# Patient Record
Sex: Female | Born: 2000 | Race: White | Hispanic: No | Marital: Single | Smoking: Never smoker
Health system: Southern US, Community
[De-identification: ages and names within clinical notes are randomized; demographics above are authoritative.]

## PROBLEM LIST (undated history)

## (undated) DIAGNOSIS — F32A Depression, unspecified: Secondary | ICD-10-CM

## (undated) DIAGNOSIS — T7840XA Allergy, unspecified, initial encounter: Secondary | ICD-10-CM

## (undated) DIAGNOSIS — Z720 Tobacco use: Secondary | ICD-10-CM

## (undated) DIAGNOSIS — J45909 Unspecified asthma, uncomplicated: Secondary | ICD-10-CM

## (undated) DIAGNOSIS — F1290 Cannabis use, unspecified, uncomplicated: Secondary | ICD-10-CM

## (undated) DIAGNOSIS — L0591 Pilonidal cyst without abscess: Secondary | ICD-10-CM

## (undated) DIAGNOSIS — F419 Anxiety disorder, unspecified: Secondary | ICD-10-CM

## (undated) DIAGNOSIS — K219 Gastro-esophageal reflux disease without esophagitis: Secondary | ICD-10-CM

## (undated) DIAGNOSIS — Z862 Personal history of diseases of the blood and blood-forming organs and certain disorders involving the immune mechanism: Secondary | ICD-10-CM

## (undated) DIAGNOSIS — L732 Hidradenitis suppurativa: Secondary | ICD-10-CM

## (undated) HISTORY — DX: Anxiety disorder, unspecified: F41.9

## (undated) HISTORY — DX: Unspecified asthma, uncomplicated: J45.909

## (undated) HISTORY — DX: Allergy, unspecified, initial encounter: T78.40XA

## (undated) HISTORY — DX: Depression, unspecified: F32.A

## (undated) HISTORY — PX: NO PAST SURGERIES: SHX2092

---
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
## 2021-04-03 DIAGNOSIS — R5383 Other fatigue: Secondary | ICD-10-CM | POA: Diagnosis not present

## 2021-04-03 DIAGNOSIS — R69 Illness, unspecified: Secondary | ICD-10-CM | POA: Diagnosis not present

## 2021-04-08 DIAGNOSIS — R5382 Chronic fatigue, unspecified: Secondary | ICD-10-CM | POA: Diagnosis not present

## 2021-04-23 DIAGNOSIS — R69 Illness, unspecified: Secondary | ICD-10-CM | POA: Diagnosis not present

## 2021-04-23 DIAGNOSIS — F321 Major depressive disorder, single episode, moderate: Secondary | ICD-10-CM | POA: Diagnosis not present

## 2021-05-15 DIAGNOSIS — F321 Major depressive disorder, single episode, moderate: Secondary | ICD-10-CM | POA: Diagnosis not present

## 2021-05-15 DIAGNOSIS — R69 Illness, unspecified: Secondary | ICD-10-CM | POA: Diagnosis not present

## 2021-05-22 DIAGNOSIS — R69 Illness, unspecified: Secondary | ICD-10-CM | POA: Diagnosis not present

## 2021-05-22 DIAGNOSIS — F321 Major depressive disorder, single episode, moderate: Secondary | ICD-10-CM | POA: Diagnosis not present

## 2021-05-29 DIAGNOSIS — F321 Major depressive disorder, single episode, moderate: Secondary | ICD-10-CM | POA: Diagnosis not present

## 2021-05-29 DIAGNOSIS — R69 Illness, unspecified: Secondary | ICD-10-CM | POA: Diagnosis not present

## 2021-06-05 DIAGNOSIS — R69 Illness, unspecified: Secondary | ICD-10-CM | POA: Diagnosis not present

## 2021-06-05 DIAGNOSIS — F321 Major depressive disorder, single episode, moderate: Secondary | ICD-10-CM | POA: Diagnosis not present

## 2021-06-12 DIAGNOSIS — F321 Major depressive disorder, single episode, moderate: Secondary | ICD-10-CM | POA: Diagnosis not present

## 2021-06-12 DIAGNOSIS — R69 Illness, unspecified: Secondary | ICD-10-CM | POA: Diagnosis not present

## 2021-06-17 DIAGNOSIS — R69 Illness, unspecified: Secondary | ICD-10-CM | POA: Diagnosis not present

## 2021-06-17 DIAGNOSIS — F321 Major depressive disorder, single episode, moderate: Secondary | ICD-10-CM | POA: Diagnosis not present

## 2021-06-23 DIAGNOSIS — F321 Major depressive disorder, single episode, moderate: Secondary | ICD-10-CM | POA: Diagnosis not present

## 2021-06-23 DIAGNOSIS — R69 Illness, unspecified: Secondary | ICD-10-CM | POA: Diagnosis not present

## 2021-07-03 DIAGNOSIS — F321 Major depressive disorder, single episode, moderate: Secondary | ICD-10-CM | POA: Diagnosis not present

## 2021-07-03 DIAGNOSIS — R69 Illness, unspecified: Secondary | ICD-10-CM | POA: Diagnosis not present

## 2021-07-10 DIAGNOSIS — R69 Illness, unspecified: Secondary | ICD-10-CM | POA: Diagnosis not present

## 2021-07-10 DIAGNOSIS — F321 Major depressive disorder, single episode, moderate: Secondary | ICD-10-CM | POA: Diagnosis not present

## 2021-07-15 DIAGNOSIS — R5383 Other fatigue: Secondary | ICD-10-CM | POA: Diagnosis not present

## 2021-07-24 DIAGNOSIS — F321 Major depressive disorder, single episode, moderate: Secondary | ICD-10-CM | POA: Diagnosis not present

## 2021-07-24 DIAGNOSIS — R69 Illness, unspecified: Secondary | ICD-10-CM | POA: Diagnosis not present

## 2021-07-28 DIAGNOSIS — Z6822 Body mass index (BMI) 22.0-22.9, adult: Secondary | ICD-10-CM | POA: Diagnosis not present

## 2021-07-28 DIAGNOSIS — R69 Illness, unspecified: Secondary | ICD-10-CM | POA: Diagnosis not present

## 2021-07-31 DIAGNOSIS — F321 Major depressive disorder, single episode, moderate: Secondary | ICD-10-CM | POA: Diagnosis not present

## 2021-07-31 DIAGNOSIS — R69 Illness, unspecified: Secondary | ICD-10-CM | POA: Diagnosis not present

## 2021-08-07 DIAGNOSIS — F321 Major depressive disorder, single episode, moderate: Secondary | ICD-10-CM | POA: Diagnosis not present

## 2021-08-07 DIAGNOSIS — R69 Illness, unspecified: Secondary | ICD-10-CM | POA: Diagnosis not present

## 2021-08-14 DIAGNOSIS — R69 Illness, unspecified: Secondary | ICD-10-CM | POA: Diagnosis not present

## 2021-08-14 DIAGNOSIS — F321 Major depressive disorder, single episode, moderate: Secondary | ICD-10-CM | POA: Diagnosis not present

## 2021-08-21 DIAGNOSIS — F321 Major depressive disorder, single episode, moderate: Secondary | ICD-10-CM | POA: Diagnosis not present

## 2021-08-21 DIAGNOSIS — R69 Illness, unspecified: Secondary | ICD-10-CM | POA: Diagnosis not present

## 2021-08-28 DIAGNOSIS — F321 Major depressive disorder, single episode, moderate: Secondary | ICD-10-CM | POA: Diagnosis not present

## 2021-08-28 DIAGNOSIS — R69 Illness, unspecified: Secondary | ICD-10-CM | POA: Diagnosis not present

## 2021-09-04 DIAGNOSIS — F321 Major depressive disorder, single episode, moderate: Secondary | ICD-10-CM | POA: Diagnosis not present

## 2021-09-04 DIAGNOSIS — R69 Illness, unspecified: Secondary | ICD-10-CM | POA: Diagnosis not present

## 2021-09-11 DIAGNOSIS — R69 Illness, unspecified: Secondary | ICD-10-CM | POA: Diagnosis not present

## 2021-09-11 DIAGNOSIS — F321 Major depressive disorder, single episode, moderate: Secondary | ICD-10-CM | POA: Diagnosis not present

## 2021-09-18 DIAGNOSIS — R69 Illness, unspecified: Secondary | ICD-10-CM | POA: Diagnosis not present

## 2021-09-18 DIAGNOSIS — F321 Major depressive disorder, single episode, moderate: Secondary | ICD-10-CM | POA: Diagnosis not present

## 2021-09-25 DIAGNOSIS — R69 Illness, unspecified: Secondary | ICD-10-CM | POA: Diagnosis not present

## 2021-09-25 DIAGNOSIS — F321 Major depressive disorder, single episode, moderate: Secondary | ICD-10-CM | POA: Diagnosis not present

## 2021-10-04 DIAGNOSIS — F321 Major depressive disorder, single episode, moderate: Secondary | ICD-10-CM | POA: Diagnosis not present

## 2021-10-04 DIAGNOSIS — R69 Illness, unspecified: Secondary | ICD-10-CM | POA: Diagnosis not present

## 2021-10-11 DIAGNOSIS — R69 Illness, unspecified: Secondary | ICD-10-CM | POA: Diagnosis not present

## 2021-10-11 DIAGNOSIS — F321 Major depressive disorder, single episode, moderate: Secondary | ICD-10-CM | POA: Diagnosis not present

## 2021-10-14 DIAGNOSIS — R69 Illness, unspecified: Secondary | ICD-10-CM | POA: Diagnosis not present

## 2021-10-14 DIAGNOSIS — F321 Major depressive disorder, single episode, moderate: Secondary | ICD-10-CM | POA: Diagnosis not present

## 2021-10-25 DIAGNOSIS — F321 Major depressive disorder, single episode, moderate: Secondary | ICD-10-CM | POA: Diagnosis not present

## 2021-10-25 DIAGNOSIS — R69 Illness, unspecified: Secondary | ICD-10-CM | POA: Diagnosis not present

## 2021-11-01 DIAGNOSIS — R69 Illness, unspecified: Secondary | ICD-10-CM | POA: Diagnosis not present

## 2021-11-01 DIAGNOSIS — F321 Major depressive disorder, single episode, moderate: Secondary | ICD-10-CM | POA: Diagnosis not present

## 2021-12-20 DIAGNOSIS — F321 Major depressive disorder, single episode, moderate: Secondary | ICD-10-CM | POA: Diagnosis not present

## 2021-12-20 DIAGNOSIS — F411 Generalized anxiety disorder: Secondary | ICD-10-CM | POA: Diagnosis not present

## 2021-12-20 DIAGNOSIS — R69 Illness, unspecified: Secondary | ICD-10-CM | POA: Diagnosis not present

## 2022-01-02 DIAGNOSIS — R69 Illness, unspecified: Secondary | ICD-10-CM | POA: Diagnosis not present

## 2022-01-02 DIAGNOSIS — F321 Major depressive disorder, single episode, moderate: Secondary | ICD-10-CM | POA: Diagnosis not present

## 2022-01-10 DIAGNOSIS — R69 Illness, unspecified: Secondary | ICD-10-CM | POA: Diagnosis not present

## 2022-01-10 DIAGNOSIS — F321 Major depressive disorder, single episode, moderate: Secondary | ICD-10-CM | POA: Diagnosis not present

## 2022-11-22 NOTE — Progress Notes (Unsigned)
New patient visit  Patient: Kerry Mcgee   DOB: 2001-01-09   22 y.o. Adult  MRN: [id number] Visit Date: 11/26/2022  Today's healthcare provider: Debera Lat, PA-C   No chief complaint on file.  Subjective    Kerry Mcgee WEIGHT is a 22 y.o. adult who presents today as a new patient to establish care.  HPI  *** Discussed the use of AI scribe software for clinical note transcription with the patient, who gave verbal consent to proceed.  History of Present Illness            Past Medical History:  Diagnosis Date   Asthma    No past surgical history on file. No family status information on file.   No family history on file. Social History   Socioeconomic History   Marital status: Single    Spouse name: Not on file   Number of children: Not on file   Years of education: Not on file   Highest education level: Not on file  Occupational History   Not on file  Tobacco Use   Smoking status: Never   Smokeless tobacco: Not on file  Substance and Sexual Activity   Alcohol use: No    Alcohol/week: 0.0 standard drinks of alcohol   Drug use: No   Sexual activity: Not on file  Other Topics Concern   Not on file  Social History Narrative   Not on file   Social Determinants of Health   Financial Resource Strain: Not on file  Food Insecurity: Not on file  Transportation Needs: Not on file  Physical Activity: Not on file  Stress: Not on file  Social Connections: Not on file   Outpatient Medications Prior to Visit  Medication Sig   albuterol (PROVENTIL HFA;VENTOLIN HFA) 108 (90 BASE) MCG/ACT inhaler Inhale 1 puff into the lungs every 6 (six) hours as needed for wheezing or shortness of breath.   beclomethasone (QVAR) 40 MCG/ACT inhaler Inhale into the lungs 2 (two) times daily.   cetirizine (ZYRTEC) 10 MG tablet Take 10 mg by mouth daily.   montelukast (SINGULAIR) 10 MG tablet Take 10 mg by mouth at bedtime.   Olopatadine HCl 0.6 % SOLN Place into the nose.   No  facility-administered medications prior to visit.   Allergies  Allergen Reactions   Dust Mite Extract Other (See Comments)    Wheezing    Other Other (See Comments)    Cats - wheezing   Shellfish Allergy Other (See Comments)    Allergy tested - tested positive   Sulfa Antibiotics Rash    Immunization History  Administered Date(s) Administered   PFIZER(Purple Top)SARS-COV-2 Vaccination 06/03/2019, 06/28/2019    Health Maintenance  Topic Date Due   HPV VACCINES (1 - 3-dose series) Never done   HIV Screening  Never done   Hepatitis C Screening  Never done   DTaP/Tdap/Td (1 - Tdap) Never done   Cervical Cancer Screening (Pap smear)  Never done   INFLUENZA VACCINE  Never done   COVID-19 Vaccine (3 - 2023-24 season) 10/25/2022    Patient Care Team: Charlton Amor, MD as PCP - General (Pediatrics) Kieth Brightly, MD (General Surgery) Charlton Amor, MD as Referring Physician (Pediatrics)  Review of Systems  All other systems reviewed and are negative.  Except see HPI   {Insert previous labs (optional):23779} {See past labs  Heme  Chem  Endocrine  Serology  Results Review (optional):1}   Objective    There were no  vitals taken for this visit. {Insert last BP/Wt (optional):23777}{See vitals history (optional):1}   Physical Exam Vitals reviewed.  Constitutional:      General: [name].    Appearance: Normal appearance. [name] is not diaphoretic.  HENT:     Head: Normocephalic and atraumatic.  Eyes:     General: No scleral icterus.    Conjunctiva/sclera: Conjunctivae normal.  Cardiovascular:     Rate and Rhythm: Normal rate and regular rhythm.     Pulses: Normal pulses.     Heart sounds: Normal heart sounds. No murmur heard. Pulmonary:     Effort: Pulmonary effort is normal. No respiratory distress.     Breath sounds: Normal breath sounds. No wheezing or rhonchi.  Musculoskeletal:     Cervical back: Neck supple.     Right  lower leg: No edema.     Left lower leg: No edema.  Lymphadenopathy:     Cervical: No cervical adenopathy.  Skin:    General: Skin is warm and dry.     Findings: No rash.  Neurological:     Mental Status: [name] is alert and oriented to person, place, and time. Mental status is at baseline.  Psychiatric:        Mood and Affect: Mood normal.        Behavior: Behavior normal.     Depression Screen     No data to display         No results found for any visits on 11/26/22.  Assessment & Plan     *** Assessment and Plan              Encounter to establish care Welcomed to our clinic Reviewed past medical hx, social hx, family hx and surgical hx Pt advised to send all vaccination records or screening   No follow-ups on file.    The patient was advised to call back or seek an in-person evaluation if the symptoms worsen or if the condition fails to improve as anticipated.  I discussed the assessment and treatment plan with the patient. The patient was provided an opportunity to ask questions and all were answered. The patient agreed with the plan and demonstrated an understanding of the instructions.  I, Debera Lat, PA-C have reviewed all documentation for this visit. The documentation on  11/26/22  for the exam, diagnosis, procedures, and orders are all accurate and complete.  Debera Lat, [facility], MMS [facility] [phone] (phone) [phone] (fax)  [facility]

## 2022-11-26 ENCOUNTER — Encounter: Payer: Self-pay | Admitting: Physician Assistant

## 2022-11-26 ENCOUNTER — Other Ambulatory Visit: Payer: Self-pay

## 2022-11-26 ENCOUNTER — Ambulatory Visit: Payer: Managed Care, Other (non HMO) | Admitting: Physician Assistant

## 2022-11-26 VITALS — BP 120/69 | HR 81 | Temp 97.7°F | Ht 63.0 in | Wt 141.6 lb

## 2022-11-26 DIAGNOSIS — F419 Anxiety disorder, unspecified: Secondary | ICD-10-CM

## 2022-11-26 DIAGNOSIS — R5383 Other fatigue: Secondary | ICD-10-CM

## 2022-11-26 DIAGNOSIS — J302 Other seasonal allergic rhinitis: Secondary | ICD-10-CM | POA: Diagnosis not present

## 2022-11-26 DIAGNOSIS — Z7689 Persons encountering health services in other specified circumstances: Secondary | ICD-10-CM

## 2022-11-26 DIAGNOSIS — Z23 Encounter for immunization: Secondary | ICD-10-CM

## 2022-11-26 DIAGNOSIS — F32A Depression, unspecified: Secondary | ICD-10-CM | POA: Insufficient documentation

## 2022-11-26 DIAGNOSIS — R0683 Snoring: Secondary | ICD-10-CM

## 2022-11-26 DIAGNOSIS — J4540 Moderate persistent asthma, uncomplicated: Secondary | ICD-10-CM | POA: Diagnosis not present

## 2022-11-26 MED ORDER — ALBUTEROL SULFATE HFA 108 (90 BASE) MCG/ACT IN AERS
INHALATION_SPRAY | RESPIRATORY_TRACT | 11 refills | Status: AC
  Filled 2022-11-26: qty 6.7, 25d supply, fill #0

## 2022-11-26 MED ORDER — MONTELUKAST SODIUM 10 MG PO TABS
10.0000 mg | ORAL_TABLET | Freq: Every day | ORAL | 11 refills | Status: DC
  Filled 2022-11-26: qty 30, 30d supply, fill #0
  Filled 2023-01-04: qty 30, 30d supply, fill #1
  Filled 2023-02-11: qty 30, 30d supply, fill #2
  Filled 2023-03-18: qty 30, 30d supply, fill #3

## 2022-11-26 MED ORDER — FLUOXETINE HCL 10 MG PO CAPS
10.0000 mg | ORAL_CAPSULE | Freq: Every day | ORAL | 3 refills | Status: DC
Start: 2022-11-26 — End: 2023-09-29
  Filled 2022-11-26: qty 90, 90d supply, fill #0
  Filled 2023-03-18: qty 30, 30d supply, fill #1

## 2022-11-26 MED ORDER — BECLOMETHASONE DIPROP HFA 40 MCG/ACT IN AERB
1.0000 | INHALATION_SPRAY | Freq: Two times a day (BID) | RESPIRATORY_TRACT | 2 refills | Status: DC
Start: 2022-11-26 — End: 2023-01-05
  Filled 2022-11-26: qty 10.6, 30d supply, fill #0

## 2022-11-26 MED ORDER — BUDESONIDE-FORMOTEROL FUMARATE 80-4.5 MCG/ACT IN AERO
2.0000 | INHALATION_SPRAY | Freq: Two times a day (BID) | RESPIRATORY_TRACT | 3 refills | Status: DC
Start: 2022-11-26 — End: 2023-12-30
  Filled 2022-11-26 – 2023-01-15 (×2): qty 10.2, 30d supply, fill #0

## 2022-11-26 MED ORDER — CETIRIZINE HCL 10 MG PO TABS
10.0000 mg | ORAL_TABLET | Freq: Every day | ORAL | 11 refills | Status: AC
  Filled 2022-11-26: qty 30, 30d supply, fill #0
  Filled 2023-01-04: qty 30, 30d supply, fill #1
  Filled 2023-02-11: qty 30, 30d supply, fill #2
  Filled 2023-03-18: qty 30, 30d supply, fill #3

## 2022-11-26 MED ORDER — FLUTICASONE FUROATE 100 MCG/ACT IN AEPB
INHALATION_SPRAY | RESPIRATORY_TRACT | 1 refills | Status: DC
Start: 2022-11-26 — End: 2023-12-30
  Filled 2022-11-26: qty 30, fill #0

## 2022-11-27 LAB — TSH: TSH: 2.73 u[IU]/mL (ref 0.450–4.500)

## 2022-11-27 LAB — COMPREHENSIVE METABOLIC PANEL
ALT: 17 [IU]/L (ref 0–32)
AST: 24 [IU]/L (ref 0–40)
Albumin: 4.2 g/dL (ref 4.0–5.0)
Alkaline Phosphatase: 76 [IU]/L (ref 44–121)
BUN/Creatinine Ratio: 14 (ref 9–23)
BUN: 10 mg/dL (ref 6–20)
Bilirubin Total: 0.3 mg/dL (ref 0.0–1.2)
CO2: 22 mmol/L (ref 20–29)
Calcium: 9.4 mg/dL (ref 8.7–10.2)
Chloride: 103 mmol/L (ref 96–106)
Creatinine, Ser: 0.73 mg/dL (ref 0.57–1.00)
Globulin, Total: 2.6 g/dL (ref 1.5–4.5)
Glucose: 87 mg/dL (ref 70–99)
Potassium: 4.5 mmol/L (ref 3.5–5.2)
Sodium: 140 mmol/L (ref 134–144)
Total Protein: 6.8 g/dL (ref 6.0–8.5)
eGFR: 120 mL/min/{1.73_m2} (ref >59)

## 2022-11-27 LAB — LIPID PANEL
Chol/HDL Ratio: 3 {ratio} (ref 0.0–4.4)
Cholesterol, Total: 169 mg/dL (ref 100–199)
HDL: 56 mg/dL (ref >39)
LDL Chol Calc (NIH): 100 mg/dL — ABNORMAL HIGH (ref 0–99)
Triglycerides: 70 mg/dL (ref 0–149)
VLDL Cholesterol Cal: 13 mg/dL (ref 5–40)

## 2022-11-27 LAB — CBC WITH DIFFERENTIAL/PLATELET
Basophils Absolute: 0.1 10*3/uL (ref 0.0–0.2)
Basos: 1 %
EOS (ABSOLUTE): 0.7 10*3/uL — ABNORMAL HIGH (ref 0.0–0.4)
Eos: 8 %
Hematocrit: 40.4 % (ref 34.0–46.6)
Hemoglobin: 13 g/dL (ref 11.1–15.9)
Immature Grans (Abs): 0 10*3/uL (ref 0.0–0.1)
Immature Granulocytes: 1 %
Lymphocytes Absolute: 2 10*3/uL (ref 0.7–3.1)
Lymphs: 25 %
MCH: 29.1 pg (ref 26.6–33.0)
MCHC: 32.2 g/dL (ref 31.5–35.7)
MCV: 90 fL (ref 79–97)
Monocytes Absolute: 0.5 10*3/uL (ref 0.1–0.9)
Monocytes: 6 %
Neutrophils Absolute: 4.7 10*3/uL (ref 1.4–7.0)
Neutrophils: 59 %
Platelets: 259 10*3/uL (ref 150–450)
RBC: 4.47 x10E6/uL (ref 3.77–5.28)
RDW: 12.7 % (ref 11.7–15.4)
WBC: 7.9 10*3/uL (ref 3.4–10.8)

## 2022-11-27 LAB — HEMOGLOBIN A1C
Est. average glucose Bld gHb Est-mCnc: 94 mg/dL
Hgb A1c MFr Bld: 4.9 % (ref 4.8–5.6)

## 2022-11-27 NOTE — Progress Notes (Signed)
All labs are stable with some variations. Advised healthy diet and daily exercise.

## 2022-11-30 ENCOUNTER — Other Ambulatory Visit: Payer: Self-pay

## 2022-12-27 NOTE — Progress Notes (Unsigned)
Complete physical exam  Patient: Kerry Mcgee   DOB: 07-31-2000   21 y.o. Adult  MRN: [id number] Visit Date: 01/01/2023  Today's healthcare provider: Debera Lat, PA-C   Chief Complaint  Patient presents with   Annual Exam    Diet - generl Exercise -Patient reports trying to go walk at least once a week for 20 minutes Feeling well Sleeping well Concerns none   Immunizations    Consented to updating tetanus vaccine   Subjective    Kerry Mcgee is a 22 y.o. adult who presents today for a complete physical exam.   Discussed the use of AI scribe software for clinical note transcription with the patient, who gave verbal consent to proceed.  History of Present Illness   The patient presents with severe menstrual pain, which has worsened with age. The pain is so severe that she often resorts to taking acetaminophen, which she otherwise avoids. She reports a family history of severe menstrual pain, as her mother also experienced similar symptoms to the point of vomiting.  In addition to the menstrual pain, the patient reports a change in her vision and believes she may need new glasses. She also mentions a need for a dental appointment due to a cavity in one of her wisdom teeth, which is not currently causing pain. She also reports a recent swelling of a lymph node under her arm, which has since returned to normal.  The patient also mentions a history of asthma, which has improved since she started using a rescue inhaler. She is unsure if her insurance covers the inhaler.  The patient also reports irregular bowel movements, which she attributes to a poor diet. She denies any changes in the consistency of her stool.        Last depression screening scores    01/01/2023    8:24 AM  PHQ 2/9 Scores  PHQ - 2 Score 6  PHQ- 9 Score 22   Last fall risk screening    01/01/2023    8:24 AM  Fall Risk   Falls in the past year? 1  Number falls in past yr: 1  Injury with  Fall? 1  Risk for fall due to : History of fall(s)  Follow up Falls evaluation completed   Last Audit-C alcohol use screening     No data to display         A score of 3 or more in women, and 4 or more in men indicates increased risk for alcohol abuse, EXCEPT if all of the points are from question 1   Past Medical History:  Diagnosis Date   Asthma    History reviewed. No pertinent surgical history. Social History   Socioeconomic History   Marital status: Single    Spouse name: Not on file   Number of children: Not on file   Years of education: Not on file   Highest education level: Not on file  Occupational History   Not on file  Tobacco Use   Smoking status: Never   Smokeless tobacco: Not on file  Substance and Sexual Activity   Alcohol use: No    Alcohol/week: 0.0 standard drinks of alcohol   Drug use: No   Sexual activity: Not on file  Other Topics Concern   Not on file  Social History Narrative   Not on file   Social Determinants of Health   Financial Resource Strain: Not on file  Food Insecurity: Not on file  Transportation Needs: Not on file  Physical Activity: Not on file  Stress: Not on file  Social Connections: Not on file  Intimate Partner Violence: Not on file   No family status information on file.   History reviewed. No pertinent family history. Allergies  Allergen Reactions   Dust Mite Extract Other (See Comments)    Wheezing    Other Other (See Comments)    Cats - wheezing   Shellfish Allergy Other (See Comments)    Allergy tested - tested positive   Sulfa Antibiotics Rash    Patient Care Team: Debera Lat, PA-C as PCP - General (Physician Assistant) Kieth Brightly, MD (General Surgery) Charlton Amor, MD as Referring Physician (Pediatrics)   Medications: Outpatient Medications Prior to Visit  Medication Sig   albuterol (VENTOLIN HFA) 108 (90 Base) MCG/ACT inhaler Inhale 1-2 puffs into the lungs once every 4 to 6  hours as needed.   beclomethasone (QVAR) 40 MCG/ACT inhaler Inhale into the lungs 2 (two) times daily.   budesonide-formoterol (SYMBICORT) 80-4.5 MCG/ACT inhaler Inhale 2 puffs into the lungs 2 (two) times daily.   cetirizine (ZYRTEC) 10 MG tablet Take 1 tablet (10 mg total) by mouth daily.   FLUoxetine (PROZAC) 10 MG capsule Take 1 capsule (10 mg total) by mouth daily.   Fluticasone Furoate 100 MCG/ACT AEPB Inhale 1 puff (100 mcg total) into the lungs once daily. Do not exceed 1 dose per day.   montelukast (SINGULAIR) 10 MG tablet Take 1 tablet (10 mg total) by mouth at bedtime.   olopatadine (PATANOL) 0.1 % ophthalmic solution 1 drop into affected eye Ophthalmic Twice a day for 30 days   Olopatadine HCl 0.6 % SOLN Place into the nose.   beclomethasone (QVAR) 40 MCG/ACT inhaler Inhale 1 puff into the lungs 2 (two) times daily. Initial, 40 to 80 mcg oral inhalation twice daily about 12 hours apart. Titration, may increase dosage for inadequate response after 2 weeks. (Patient not taking: Reported on 01/01/2023)   No facility-administered medications prior to visit.    Review of Systems  All other systems reviewed and are negative.  Except see HPI  {Insert previous labs (optional):23779} {See past labs  Heme  Chem  Endocrine  Serology  Results Review (optional):1}  Objective    BP 116/62 (BP Location: Left Arm, Patient Position: Sitting, Cuff Size: Normal)   Pulse 75   Ht 5\' 3"  (1.6 m)   Wt 143 lb 3.2 oz (65 kg)   LMP 12/31/2022   SpO2 100%   BMI 25.37 kg/m  {Insert last BP/Wt (optional):23777}{See vitals history (optional):1}    Physical Exam Vitals reviewed.  Constitutional:      General: Kerry Mcgee is not in acute distress.    Appearance: Normal appearance. Kerry Mcgee is well-developed. Kerry Mcgee is not ill-appearing, toxic-appearing or diaphoretic.  HENT:     Head: Normocephalic and atraumatic.     Right Ear: Tympanic membrane, ear canal and external ear normal.     Left Ear: Tympanic  membrane, ear canal and external ear normal.     Nose: Nose normal. No congestion or rhinorrhea.     Mouth/Throat:     Mouth: Mucous membranes are moist.     Pharynx: Oropharynx is clear. No oropharyngeal exudate.  Eyes:     General: No scleral icterus.       Right eye: No discharge.        Left eye: No discharge.     Conjunctiva/sclera: Conjunctivae normal.  Pupils: Pupils are equal, round, and reactive to light.  Neck:     Thyroid: No thyromegaly.     Vascular: No carotid bruit.  Cardiovascular:     Rate and Rhythm: Normal rate and regular rhythm.     Pulses: Normal pulses.     Heart sounds: Normal heart sounds. No murmur heard.    No friction rub. No gallop.  Pulmonary:     Effort: Pulmonary effort is normal. No respiratory distress.     Breath sounds: Normal breath sounds. No wheezing or rales.  Abdominal:     General: Abdomen is flat. Bowel sounds are normal. There is no distension.     Palpations: Abdomen is soft. There is no mass.     Tenderness: There is no abdominal tenderness. There is no right CVA tenderness, left CVA tenderness, guarding or rebound.     Hernia: No hernia is present.  Musculoskeletal:        General: No swelling, tenderness, deformity or signs of injury. Normal range of motion.     Cervical back: Normal range of motion and neck supple. No rigidity or tenderness.     Right lower leg: No edema.     Left lower leg: No edema.  Lymphadenopathy:     Cervical: No cervical adenopathy.  Skin:    General: Skin is warm and dry.     Coloration: Skin is not jaundiced or pale.     Findings: No bruising, erythema, lesion or rash.  Neurological:     Mental Status: Kerry Mcgee is alert and oriented to person, place, and time. Mental status is at baseline.     Gait: Gait normal.  Psychiatric:        Mood and Affect: Mood normal.        Behavior: Behavior normal.        Thought Content: Thought content normal.        Judgment: Judgment normal.      No results  found for any visits on 01/01/23.  Assessment & Plan    Routine Health Maintenance and Physical Exam  Exercise Activities and Dietary recommendations  Goals   None     Immunization History  Administered Date(s) Administered   DTaP 04/07/2001, 06/08/2001, 08/05/2001, 05/30/2002, 06/29/2006   HIB (PRP-OMP) 04/07/2001, 06/08/2001, 08/05/2001, 05/30/2002   HPV 9-valent 10/05/2013, 05/29/2015   Hepatitis A, Ped/Adol-2 Dose 06/29/2006, 09/22/2012   Hepatitis B, PED/ADOLESCENT 09/30/2000, 03/09/2001, 11/23/2001   IPV 04/07/2001, 06/08/2001, 11/23/2001, 06/29/2006   Influenza, Seasonal, Injecte, Preservative Fre 11/26/2022   MMR 02/06/2002, 06/29/2006   Meningococcal B, OMV 04/26/2018, 12/23/2018   Meningococcal Mcv4o 09/22/2012, 03/04/2017   PFIZER(Purple Top)SARS-COV-2 Vaccination 06/03/2019, 06/28/2019   Pneumococcal Conjugate PCV 7 04/07/2001, 06/08/2001, 08/05/2001, 02/06/2002   Tdap 09/22/2012, 01/01/2023   Varicella 02/06/2002, 09/22/2012    Health Maintenance  Topic Date Due   HIV Screening  Never done   Hepatitis C Screening  Never done   Pap Smear  Never done   COVID-19 Vaccine (3 - 2023-24 season) 10/25/2022   DTaP/Tdap/Td vaccine (8 - Td or Tdap) 12/31/2032   Flu Shot  Completed   HPV Vaccine  Completed    Discussed health benefits of physical activity, and encouraged Kerry Mcgee to engage in regular exercise appropriate for Kerry Mcgee's age and condition.  1. Annual physical exam Needs dental/eye exams Things to do to keep yourself healthy  - Exercise at least 30-45 minutes a day, 3-4 days a week.  - Eat a low-fat diet with lots of fruits  and vegetables, up to 7-9 servings per day.  - Seatbelts can save your life. Wear them always.  - Smoke detectors on every level of your home, check batteries every year.  - Eye Doctor - have an eye exam every 1-2 years  - Safe sex - if you may be exposed to STDs, use a condom.  - Alcohol -  If you drink, do it moderately, less than 2 drinks  per day.  - Health Care Power of Attorney. Choose someone to speak for you if you are not able.  - Depression is common in our stressful world.If you're feeling down or losing interest in things you normally enjoy, please come in for a visit.  - Violence - If anyone is threatening or hurting you, please call immediately.   2. Need for tetanus, diphtheria, and acellular pertussis (Tdap) vaccine in patient of adolescent age or older  - Administer Tetanus-diphtheria-acellular pertussis (Tdap) vaccine   Return in about 1 year (around 01/01/2024) for CPE, please, schedule a fu for pap smear in 2 weeks.    The patient was advised to call back or seek an in-person evaluation if the symptoms worsen or if the condition fails to improve as anticipated.  I discussed the assessment and treatment plan with the patient. The patient was provided an opportunity to ask questions and all were answered. The patient agreed with the plan and demonstrated an understanding of the instructions.  I, Debera Lat, PA-C have reviewed all documentation for this visit. The documentation on  111/8/24  for the exam, diagnosis, procedures, and orders are all accurate and complete.  Debera Lat, [facility], MMS [facility] [phone] (phone) [phone] (fax)  [facility]

## 2022-12-30 ENCOUNTER — Encounter: Payer: Managed Care, Other (non HMO) | Admitting: Physician Assistant

## 2023-01-01 ENCOUNTER — Encounter: Payer: Self-pay | Admitting: Physician Assistant

## 2023-01-01 ENCOUNTER — Ambulatory Visit (INDEPENDENT_AMBULATORY_CARE_PROVIDER_SITE_OTHER): Payer: Managed Care, Other (non HMO) | Admitting: Physician Assistant

## 2023-01-01 VITALS — BP 116/62 | HR 75 | Ht 63.0 in | Wt 143.2 lb

## 2023-01-01 DIAGNOSIS — Z0000 Encounter for general adult medical examination without abnormal findings: Secondary | ICD-10-CM | POA: Diagnosis not present

## 2023-01-01 DIAGNOSIS — Z23 Encounter for immunization: Secondary | ICD-10-CM | POA: Diagnosis not present

## 2023-01-04 ENCOUNTER — Other Ambulatory Visit: Payer: Self-pay

## 2023-01-05 ENCOUNTER — Ambulatory Visit (INDEPENDENT_AMBULATORY_CARE_PROVIDER_SITE_OTHER): Payer: Managed Care, Other (non HMO) | Admitting: Physician Assistant

## 2023-01-05 ENCOUNTER — Other Ambulatory Visit: Payer: Self-pay

## 2023-01-05 ENCOUNTER — Encounter: Payer: Self-pay | Admitting: Physician Assistant

## 2023-01-05 VITALS — BP 123/74 | HR 68 | Resp 16 | Ht 63.0 in | Wt 140.4 lb

## 2023-01-05 DIAGNOSIS — L02411 Cutaneous abscess of right axilla: Secondary | ICD-10-CM

## 2023-01-05 MED ORDER — AMOXICILLIN-POT CLAVULANATE 875-125 MG PO TABS
1.0000 | ORAL_TABLET | Freq: Two times a day (BID) | ORAL | 0 refills | Status: DC
Start: 2023-01-05 — End: 2023-01-15
  Filled 2023-01-05: qty 20, 10d supply, fill #0

## 2023-01-05 NOTE — Progress Notes (Unsigned)
Established patient visit  Patient: Kerry Mcgee   DOB: December 05, 2000   21 y.o. Adult  MRN: [id number] Visit Date: 01/05/2023  Today's healthcare provider: Debera Lat, PA-C   Chief Complaint  Patient presents with   Other    Patient has an abscess under right arm. Its been there about a week. It is very painful.    Subjective     Discussed the use of AI scribe software for clinical note transcription with the patient, who gave verbal consent to proceed.  History of Present Illness   The patient presents with a painful abscess in the armpit that has been present for over a week. They initially thought it was a swollen lymph node and did not seek immediate medical attention. The patient reports two distinct bumps in the area, one of which is red and more visible. They have a history of a similar abscess on the tailbone, which was lanced at a surgical center. The patient had an allergic reaction to sulfa antibiotics prescribed post-procedure, presenting as a non-itchy, splotchy rash all over the body. They are unsure of their tolerance to other antibiotics as most of their experiences with them were during childhood. The patient has not had any adverse reactions to topical antibiotics like Neosporin. They have been managing the pain with acetaminophen and a numbing cream intended for teeth.           01/01/2023    8:24 AM  Depression screen PHQ 2/9  Decreased Interest 3  Down, Depressed, Hopeless 3  PHQ - 2 Score 6  Altered sleeping 3  Tired, decreased energy 3  Change in appetite 2  Feeling bad or failure about yourself  3  Trouble concentrating 2  Moving slowly or fidgety/restless 1  Suicidal thoughts 2  PHQ-9 Score 22  Difficult doing work/chores Very difficult      01/01/2023    8:24 AM  GAD 7 : Generalized Anxiety Score  Nervous, Anxious, on Edge 3  Control/stop worrying 2  Worry too much - different things 3  Trouble relaxing 3  Restless 2  Easily annoyed or  irritable 3  Afraid - awful might happen 3  Total GAD 7 Score 19  Anxiety Difficulty Extremely difficult    Medications: Outpatient Medications Prior to Visit  Medication Sig   albuterol (VENTOLIN HFA) 108 (90 Base) MCG/ACT inhaler Inhale 1-2 puffs into the lungs once every 4 to 6 hours as needed.   beclomethasone (QVAR) 40 MCG/ACT inhaler Inhale into the lungs 2 (two) times daily.   budesonide-formoterol (SYMBICORT) 80-4.5 MCG/ACT inhaler Inhale 2 puffs into the lungs 2 (two) times daily.   cetirizine (ZYRTEC) 10 MG tablet Take 1 tablet (10 mg total) by mouth daily.   FLUoxetine (PROZAC) 10 MG capsule Take 1 capsule (10 mg total) by mouth daily.   Fluticasone Furoate 100 MCG/ACT AEPB Inhale 1 puff (100 mcg total) into the lungs once daily. Do not exceed 1 dose per day.   montelukast (SINGULAIR) 10 MG tablet Take 1 tablet (10 mg total) by mouth at bedtime.   olopatadine (PATANOL) 0.1 % ophthalmic solution 1 drop into affected eye Ophthalmic Twice a day for 30 days   Olopatadine HCl 0.6 % SOLN Place into the nose.   [DISCONTINUED] beclomethasone (QVAR) 40 MCG/ACT inhaler Inhale 1 puff into the lungs 2 (two) times daily. Initial, 40 to 80 mcg oral inhalation twice daily about 12 hours apart. Titration, may increase dosage for inadequate response after 2 weeks. (Patient not  taking: Reported on 01/01/2023)   No facility-administered medications prior to visit.    Review of Systems  All other systems reviewed and are negative.  Except see HPI   {Insert previous labs (optional):23779} {See past labs  Heme  Chem  Endocrine  Serology  Results Review (optional):1}   Objective    BP 123/74   Pulse 68   Resp 16   Ht 5\' 3"  (1.6 m)   Wt 140 lb 6.4 oz (63.7 kg)   LMP 12/31/2022   SpO2 100%   BMI 24.87 kg/m  {Insert last BP/Wt (optional):23777}{See vitals history (optional):1}   Physical Exam Constitutional:      General: Kerry Mcgee is not in acute distress.    Appearance: Normal  appearance.  HENT:     Head: Normocephalic.  Pulmonary:     Effort: Pulmonary effort is normal. No respiratory distress.  Skin:    Findings: Lesion present.  Neurological:     Mental Status: Kerry Mcgee is alert and oriented to person, place, and time. Mental status is at baseline.      No results found for any visits on 01/05/23.  Assessment & Plan        Skin Abscess Two painful, inflamed, hard bumps in the axilla present for over a week. Not yet ripe for lancing. Patient has a history of allergic reaction to sulfa antibiotics. -Prescribe Augmentin, to be taken twice daily for 10 days with meals. -Advise warm compresses to promote ripening. -Advise patient to monitor for any allergic reactions and to report any changes in the abscesses. -If no improvement in 2-3 days, consider referral to a surgeon for possible lancing. -Advise patient to take over-the-counter pain relief (extra strength Tylenol, Advil or ibuprofen with meals) as needed for pain control. -Advise patient to take yogurt for its probiotic benefits and to monitor for signs of yeast infection as a side effect of the antibiotic.      No follow-ups on file.     The patient was advised to call back or seek an in-person evaluation if the symptoms worsen or if the condition fails to improve as anticipated.  I discussed the assessment and treatment plan with the patient. The patient was provided an opportunity to ask questions and all were answered. The patient agreed with the plan and demonstrated an understanding of the instructions.  I, Kerry Lat, PA-C have reviewed all documentation for this visit. The documentation on  01/05/23 for the exam, diagnosis, procedures, and orders are all accurate and complete.  Kerry Mcgee, [facility], MMS [facility] [phone] (phone) [phone] (fax)  [facility]

## 2023-01-13 NOTE — Progress Notes (Signed)
Established patient visit  Patient: Kerry Mcgee   DOB: [birth year]   21 y.o. Adult  MRN: [id number] Visit Date: 01/14/2023  Today's healthcare provider: Debera Lat, PA-C   Chief Complaint  Patient presents with   Gynecologic Exam       Subjective     HPI     Gynecologic Exam    Patient first pap smear. Patient reports she is currently not sexually active but has been previously. Reports she is okay with STD/STI screenings.       Last edited by Kerry Mcgee, CMA on 01/14/2023 10:00 AM.         01/14/2023   10:00 AM 01/01/2023    8:24 AM  Depression screen PHQ 2/9  Decreased Interest 3 3  Down, Depressed, Hopeless 3 3  PHQ - 2 Score 6 6  Altered sleeping 3 3  Tired, decreased energy 3 3  Change in appetite 2 2  Feeling bad or failure about yourself  3 3  Trouble concentrating 3 2  Moving slowly or fidgety/restless 2 1  Suicidal thoughts 2 2  PHQ-9 Score 24 22  Difficult doing work/chores Extremely dIfficult Very difficult      01/14/2023   10:01 AM 01/01/2023    8:24 AM  GAD 7 : Generalized Anxiety Score  Nervous, Anxious, on Edge 3 3  Control/stop worrying 3 2  Worry too much - different things 3 3  Trouble relaxing 3 3  Restless 2 2  Easily annoyed or irritable 3 3  Afraid - awful might happen 3 3  Total GAD 7 Score 20 19  Anxiety Difficulty Extremely difficult Extremely difficult    Medications: Outpatient Medications Prior to Visit  Medication Sig   albuterol (VENTOLIN HFA) 108 (90 Base) MCG/ACT inhaler Inhale 1-2 puffs into the lungs once every 4 to 6 hours as needed.   amoxicillin-clavulanate (AUGMENTIN) 875-125 MG tablet Take 1 tablet by mouth 2 (two) times daily.   beclomethasone (QVAR) 40 MCG/ACT inhaler Inhale into the lungs 2 (two) times daily.   budesonide-formoterol (SYMBICORT) 80-4.5 MCG/ACT inhaler Inhale 2 puffs into the lungs 2 (two) times daily.   cetirizine (ZYRTEC) 10 MG tablet Take 1 tablet (10 mg total) by mouth daily.    FLUoxetine (PROZAC) 10 MG capsule Take 1 capsule (10 mg total) by mouth daily.   Fluticasone Furoate 100 MCG/ACT AEPB Inhale 1 puff (100 mcg total) into the lungs once daily. Do not exceed 1 dose per day.   montelukast (SINGULAIR) 10 MG tablet Take 1 tablet (10 mg total) by mouth at bedtime.   olopatadine (PATANOL) 0.1 % ophthalmic solution 1 drop into affected eye Ophthalmic Twice a day for 30 days   Olopatadine HCl 0.6 % SOLN Place into the nose.   No facility-administered medications prior to visit.    Review of Systems  All other systems reviewed and are negative.  Except see HPI       Objective    BP 124/88 (BP Location: Left Arm, Patient Position: Sitting, Cuff Size: Normal)   Pulse 84   Ht 5\' 2"  (1.575 m)   Wt 137 lb 1.6 oz (62.2 kg)   LMP 12/31/2022   SpO2 100%   BMI 25.08 kg/m     Physical Exam Constitutional:      General: Kerry Mcgee is not in acute distress.    Appearance: Normal appearance. Kerry Mcgee is not diaphoretic.  HENT:     Head: Normocephalic.  Eyes:     Conjunctiva/sclera: Conjunctivae normal.  Pulmonary:     Effort: Pulmonary effort is normal. No respiratory distress.  Abdominal:     Hernia: There is no hernia in the left inguinal area or right inguinal area.  Genitourinary:    Exam position: Lithotomy position.     Pubic Area: No rash or pubic lice.      Tanner stage (genital): 5.     Labia:        Right: No rash, tenderness, lesion or injury.        Left: No rash, tenderness, lesion or injury.      Urethra: No prolapse, urethral pain, urethral swelling or urethral lesion.     Vagina: Normal.     Cervix: Normal.     Uterus: Normal.      Adnexa: Right adnexa normal and left adnexa normal.     Rectum: Normal.  Lymphadenopathy:     Lower Body: No right inguinal adenopathy. No left inguinal adenopathy.  Neurological:     Mental Status: Kerry Mcgee is alert and oriented to person, place, and time. Mental status is at baseline.      No results found for any  visits on 01/14/23.  Assessment & Plan    1. Pap smear for cervical cancer screening Low risk screening. Denies family hx of cervical cancer. - Cytology - PAP  2. Vaginal discharge Completed a course of abx for skin abscess - Cervicovaginal ancillary only POCT UA declined  3. Screening for STD (sexually transmitted disease) Low risk, not sexually active but was previously - Cervicovaginal ancillary only - HIV antibody (with reflex) Will reassess  Skin abscess Improved Completed a course of abx Will reassess at her next appointment  No follow-ups on file.    The patient was advised to call back or seek an in-person evaluation if the symptoms worsen or if the condition fails to improve as anticipated.  I discussed the assessment and treatment plan with the patient. The patient was provided an opportunity to ask questions and all were answered. The patient agreed with the plan and demonstrated an understanding of the instructions.  I, Debera Lat, PA-C have reviewed all documentation for this visit. The documentation on  01/14/23 for the exam, diagnosis, procedures, and orders are all accurate and complete.  Debera Lat, [facility], MMS [facility] [phone] (phone) [phone] (fax)   [facility]

## 2023-01-14 ENCOUNTER — Other Ambulatory Visit (HOSPITAL_COMMUNITY)
Admission: RE | Admit: 2023-01-14 | Discharge: 2023-01-14 | Disposition: A | Discharge status: Home / Self Care | Payer: Commercial Managed Care - HMO | Source: Ambulatory Visit | Attending: Physician Assistant | Admitting: Physician Assistant

## 2023-01-14 ENCOUNTER — Ambulatory Visit: Payer: Managed Care, Other (non HMO) | Admitting: Physician Assistant

## 2023-01-14 ENCOUNTER — Other Ambulatory Visit: Payer: Self-pay | Admitting: Physician Assistant

## 2023-01-14 ENCOUNTER — Encounter: Payer: Self-pay | Admitting: Physician Assistant

## 2023-01-14 VITALS — BP 124/88 | HR 84 | Ht 62.0 in | Wt 137.1 lb

## 2023-01-14 DIAGNOSIS — N898 Other specified noninflammatory disorders of vagina: Secondary | ICD-10-CM

## 2023-01-14 DIAGNOSIS — Z124 Encounter for screening for malignant neoplasm of cervix: Secondary | ICD-10-CM

## 2023-01-14 DIAGNOSIS — Z113 Encounter for screening for infections with a predominantly sexual mode of transmission: Secondary | ICD-10-CM | POA: Insufficient documentation

## 2023-01-15 ENCOUNTER — Other Ambulatory Visit: Payer: Self-pay | Admitting: Family Medicine

## 2023-01-15 ENCOUNTER — Encounter: Payer: Self-pay | Admitting: Physician Assistant

## 2023-01-15 ENCOUNTER — Other Ambulatory Visit: Payer: Self-pay

## 2023-01-15 DIAGNOSIS — L02411 Cutaneous abscess of right axilla: Secondary | ICD-10-CM

## 2023-01-15 LAB — CERVICOVAGINAL ANCILLARY ONLY
Bacterial Vaginitis (gardnerella): NEGATIVE
Candida Glabrata: NEGATIVE
Candida Vaginitis: POSITIVE — AB
Comment: NEGATIVE
Comment: NEGATIVE
Comment: NEGATIVE

## 2023-01-15 LAB — HIV ANTIBODY (ROUTINE TESTING W REFLEX): HIV Screen 4th Generation wRfx: NONREACTIVE

## 2023-01-15 MED ORDER — FLUCONAZOLE 150 MG PO TABS
150.0000 mg | ORAL_TABLET | Freq: Once | ORAL | 0 refills | Status: AC
  Filled 2023-01-15: qty 1, 1d supply, fill #0

## 2023-01-15 MED ORDER — AMOXICILLIN-POT CLAVULANATE 875-125 MG PO TABS
1.0000 | ORAL_TABLET | Freq: Two times a day (BID) | ORAL | 0 refills | Status: DC
  Filled 2023-01-15: qty 2, 1d supply, fill #0

## 2023-01-15 NOTE — Addendum Note (Signed)
Addended by: Debera Lat on: 01/15/2023 04:11 PM   Modules accepted: Orders

## 2023-01-20 ENCOUNTER — Other Ambulatory Visit: Payer: Self-pay

## 2023-01-20 LAB — CYTOLOGY - PAP
Chlamydia: NEGATIVE
Comment: NEGATIVE
Comment: NEGATIVE
Comment: NEGATIVE
Comment: NEGATIVE
Comment: NORMAL
Diagnosis: NEGATIVE
HSV1: NEGATIVE
HSV2: NEGATIVE
High risk HPV: NEGATIVE
Neisseria Gonorrhea: NEGATIVE
Trichomonas: NEGATIVE

## 2023-03-18 ENCOUNTER — Other Ambulatory Visit: Payer: Self-pay

## 2023-03-22 ENCOUNTER — Other Ambulatory Visit: Payer: Self-pay

## 2023-06-15 ENCOUNTER — Telehealth: Payer: Self-pay

## 2023-06-15 ENCOUNTER — Ambulatory Visit: Payer: Self-pay

## 2023-06-15 NOTE — Telephone Encounter (Signed)
 Kerry Mcgee

## 2023-06-15 NOTE — Telephone Encounter (Signed)
   Chief Complaint: abcess Symptoms: lump and pain Frequency: x 1 day Pertinent Negatives: Patient denies fever Disposition: [] ED /[] Urgent Care (no appt availability in office) / [] Appointment(In office/virtual)/ []  [facility]/ [x] Home Care/ [] Refused Recommended Disposition /[] [facility]/ [x]  Follow-up with PCP Additional Notes: stated with discomfort a few days ago but noticed last night. States still under skin but discomfort about 3/10. States around 0.5 in in size.  Pt given home care advise and instructed to f/u if not improved or worsen.   Copied from CRM [id number]. Topic: Clinical - Red Word Triage >> Jun 15, 2023 11:31 AM Rennis Case wrote: Red Word that prompted transfer to Nurse Triage: Has another abscess developing under arms causing pain & discomfort Reason for Disposition  Red tender lump < 1/2 inch across (< 12 mm; smaller than a marble)  Answer Assessment - Initial Assessment Questions 1. APPEARANCE of BOIL: "What does the boil look like?"      Under skin 2. LOCATION: "Where is the boil located?"      Left arm 3. NUMBER: "How many boils are there?"      one 4. SIZE: "How big is the boil?" (e.g., inches, cm; compare to size of a coin or other object)     1/2-1 in 5. ONSET: "When did the boil start?"     Started with discomfort a few days ago 6. PAIN: "Is there any pain?" If Yes, ask: "How bad is the pain?"   (Scale 1-10; or mild, moderate, severe)     3/10 7. FEVER: "Do you have a fever?" If Yes, ask: "What is it, how was it measured, and when did it start?"      no 8. SOURCE: "Have you been around anyone with boils or other Staph infections?" "Have you ever had boils before?"     yes 9. OTHER SYMPTOMS: "Do you have any other symptoms?" (e.g., shaking chills, weakness, rash elsewhere on body)     no  Protocols used: Boil (Skin Abscess)-A-AH

## 2023-06-17 ENCOUNTER — Ambulatory Visit (INDEPENDENT_AMBULATORY_CARE_PROVIDER_SITE_OTHER): Admitting: Physician Assistant

## 2023-06-17 ENCOUNTER — Encounter: Payer: Self-pay | Admitting: Physician Assistant

## 2023-06-17 VITALS — BP 116/85 | HR 66 | Resp 16 | Ht 63.0 in | Wt 150.6 lb

## 2023-06-17 DIAGNOSIS — L02411 Cutaneous abscess of right axilla: Secondary | ICD-10-CM | POA: Diagnosis not present

## 2023-06-17 DIAGNOSIS — F419 Anxiety disorder, unspecified: Secondary | ICD-10-CM | POA: Insufficient documentation

## 2023-06-17 DIAGNOSIS — F339 Major depressive disorder, recurrent, unspecified: Secondary | ICD-10-CM | POA: Insufficient documentation

## 2023-06-17 MED ORDER — AMOXICILLIN-POT CLAVULANATE 875-125 MG PO TABS: 1.0000 | ORAL_TABLET | Freq: Two times a day (BID) | ORAL | 0 refills | Status: DC

## 2023-06-17 NOTE — Progress Notes (Signed)
 Established patient visit  Patient: Kerry Mcgee   DOB: Aug 07, 2000   22 y.o. Adult  MRN: [id number] Visit Date: 06/17/2023  Today's healthcare provider: Blane Bunting, PA-C   Chief Complaint  Patient presents with   Abscess    Abscess under arms few days   Subjective     History of Present Illness  Pt has an abscess under left arm. It is very painful <1/2 in across. Started a few days ago. Pain 3/10, no fever noted. She had similar boils in 12/2022      06/17/2023   11:15 AM 01/14/2023   10:00 AM 01/01/2023    8:24 AM  Depression screen PHQ 2/9  Decreased Interest 2 3 3   Down, Depressed, Hopeless 3 3 3   PHQ - 2 Score 5 6 6   Altered sleeping 3 3 3   Tired, decreased energy 3 3 3   Change in appetite 2 2 2   Feeling bad or failure about yourself  3 3 3   Trouble concentrating 2 3 2   Moving slowly or fidgety/restless 3 2 1   Suicidal thoughts 1 2 2   PHQ-9 Score 22 24 22   Difficult doing work/chores Very difficult Extremely dIfficult Very difficult      06/17/2023   11:16 AM 01/14/2023   10:01 AM 01/01/2023    8:24 AM  GAD 7 : Generalized Anxiety Score  Nervous, Anxious, on Edge 2 3 3   Control/stop worrying 3 3 2   Worry too much - different things 3 3 3   Trouble relaxing 2 3 3   Restless 2 2 2   Easily annoyed or irritable 3 3 3   Afraid - awful might happen 2 3 3   Total GAD 7 Score 17 20 19   Anxiety Difficulty Somewhat difficult Extremely difficult Extremely difficult    Medications: Outpatient Medications Prior to Visit  Medication Sig   albuterol  (VENTOLIN  HFA) 108 (90 Base) MCG/ACT inhaler Inhale 1-2 puffs into the lungs once every 4 to 6 hours as needed.   beclomethasone (QVAR ) 40 MCG/ACT inhaler Inhale into the lungs 2 (two) times daily.   budesonide -formoterol  (SYMBICORT ) 80-4.5 MCG/ACT inhaler Inhale 2 puffs into the lungs 2 (two) times daily.   cetirizine  (ZYRTEC ) 10 MG tablet Take 1 tablet (10 mg total) by mouth daily.   FLUoxetine  (PROZAC ) 10 MG capsule  Take 1 capsule (10 mg total) by mouth daily.   Fluticasone  Furoate 100 MCG/ACT AEPB Inhale 1 puff (100 mcg total) into the lungs once daily. Do not exceed 1 dose per day.   montelukast  (SINGULAIR ) 10 MG tablet Take 1 tablet (10 mg total) by mouth at bedtime.   olopatadine (PATANOL) 0.1 % ophthalmic solution 1 drop into affected eye Ophthalmic Twice a day for 30 days   Olopatadine HCl 0.6 % SOLN Place into the nose.   [DISCONTINUED] amoxicillin -clavulanate (AUGMENTIN ) 875-125 MG tablet Take 1 tablet by mouth 2 (two) times daily.   No facility-administered medications prior to visit.    Review of Systems  All other systems reviewed and are negative.  All negative Except see HPI       Objective    BP 116/85 (BP Location: Left Arm, Patient Position: Sitting, Cuff Size: Normal)   Pulse 66   Resp 16   Ht 5\' 3"  (1.6 m)   Wt 150 lb 9.6 oz (68.3 kg)   SpO2 100%   BMI 26.68 kg/m     Physical Exam Constitutional:      General: Kerry Mcgee is not in acute distress.    Appearance: Normal  appearance.  HENT:     Head: Normocephalic.  Pulmonary:     Effort: Pulmonary effort is normal. No respiratory distress.  Skin:    Findings: Lesion (Two painful, inflamed, hard nodules in the left axilla) present.  Neurological:     Mental Status: Kerry Mcgee is alert and oriented to person, place, and time. Mental status is at baseline.      No results found for any visits on 06/17/23.      Assessment & Plan  1. Abscess of right axilla (Primary) Acute , a few days Two painful , hard bumps in the left axilla present for over a week. Not yet ripe for lancing. Pt has a similar condition in 12/2022. Called in: - amoxicillin -clavulanate (AUGMENTIN ) 875-125 MG tablet; Take 1 tablet by mouth 2 (two) times daily.  Dispense: 2 tablet; Refill: 0 Advised to continue warm compresses, take OTC pain meds for pain  Take probiotics and monitor of sings of yeast infection  2. Anxiety Chronic Worsening    06/17/2023    11:16 AM 01/14/2023   10:01 AM 01/01/2023    8:24 AM  GAD 7 : Generalized Anxiety Score  Nervous, Anxious, on Edge 2 3 3   Control/stop worrying 3 3 2   Worry too much - different things 3 3 3   Trouble relaxing 2 3 3   Restless 2 2 2   Easily annoyed or irritable 3 3 3   Afraid - awful might happen 2 3 3   Total GAD 7 Score 17 20 19   Anxiety Difficulty Somewhat difficult Extremely difficult Extremely difficult    Increase a dose of prozac  to 20mg  daily Will reassess in a mo  3. Depression, recurrent (HCC) Chronic Worsening Increase a dose of Prozac . Pt just got her refill for prozac . Advised to take 2 tab before next visit. Will Follow-up   No orders of the defined types were placed in this encounter.   Return in about 4 weeks (around 07/15/2023) for chronic disease f/u.   The patient was advised to call back or seek an in-person evaluation if the symptoms worsen or if the condition fails to improve as anticipated.  I discussed the assessment and treatment plan with the patient. The patient was provided an opportunity to ask questions and all were answered. The patient agreed with the plan and demonstrated an understanding of the instructions.  I, Raphael Espe, PA-C have reviewed all documentation for this visit. The documentation on 06/17/2023  for the exam, diagnosis, procedures, and orders are all accurate and complete.  Blane Bunting, [facility], MMS [facility] [phone] (phone) [phone] (fax)  [facility]

## 2023-07-05 DIAGNOSIS — M4628 Osteomyelitis of vertebra, sacral and sacrococcygeal region: Secondary | ICD-10-CM | POA: Diagnosis not present

## 2023-07-06 DIAGNOSIS — L0231 Cutaneous abscess of buttock: Secondary | ICD-10-CM | POA: Diagnosis not present

## 2023-07-11 ENCOUNTER — Other Ambulatory Visit: Payer: Self-pay | Admitting: General Surgery

## 2023-07-11 DIAGNOSIS — L0501 Pilonidal cyst with abscess: Secondary | ICD-10-CM

## 2023-07-11 MED ORDER — CEPHALEXIN 750 MG PO CAPS: 750.0000 mg | ORAL_CAPSULE | Freq: Three times a day (TID) | ORAL | 0 refills | Status: AC

## 2023-07-20 ENCOUNTER — Encounter: Payer: Self-pay | Admitting: Physician Assistant

## 2023-07-20 ENCOUNTER — Other Ambulatory Visit (HOSPITAL_COMMUNITY)
Admission: RE | Admit: 2023-07-20 | Discharge: 2023-07-20 | Disposition: A | Discharge status: Home / Self Care | Source: Ambulatory Visit | Attending: Physician Assistant | Admitting: Physician Assistant

## 2023-07-20 ENCOUNTER — Ambulatory Visit (INDEPENDENT_AMBULATORY_CARE_PROVIDER_SITE_OTHER): Admitting: Physician Assistant

## 2023-07-20 VITALS — BP 110/61 | HR 74 | Resp 16 | Ht 63.0 in | Wt 148.0 lb

## 2023-07-20 DIAGNOSIS — J4540 Moderate persistent asthma, uncomplicated: Secondary | ICD-10-CM | POA: Diagnosis not present

## 2023-07-20 DIAGNOSIS — N898 Other specified noninflammatory disorders of vagina: Secondary | ICD-10-CM | POA: Diagnosis not present

## 2023-07-20 DIAGNOSIS — R300 Dysuria: Secondary | ICD-10-CM

## 2023-07-20 DIAGNOSIS — L732 Hidradenitis suppurativa: Secondary | ICD-10-CM

## 2023-07-20 LAB — POCT URINALYSIS DIPSTICK
Blood, UA: NEGATIVE
Glucose, UA: NEGATIVE
Ketones, UA: NEGATIVE
Leukocytes, UA: NEGATIVE
Nitrite, UA: NEGATIVE
Protein, UA: NEGATIVE
Spec Grav, UA: 1.02 (ref 1.010–1.025)
Urobilinogen, UA: 0.2 U/dL
pH, UA: 6 (ref 5.0–8.0)

## 2023-07-20 NOTE — Progress Notes (Signed)
 Established patient visit  Patient: Kerry Mcgee   DOB: [birth year]   22 y.o. Adult  MRN: [id number] Visit Date: 07/20/2023  Today's healthcare provider: Blane Bunting, PA-C   Chief Complaint  Patient presents with   Follow-up    1 month f/u abscess under arm comes and goes, abscess in tailbone, pt wants to have a look at her tooth. Possible yeast from Antibiotics     Subjective         Discussed the use of AI scribe software for clinical note transcription with the patient, who gave verbal consent to proceed.  History of Present Illness Kerry Mcgee "Kerry Mcgee" is a 23 year old with hidradenitis suppurativa who presents with recurrent abscesses and concerns about antibiotic use.  Kerry Mcgee experiences recurrent abscesses, with a recent flare-up on the tailbone requiring lancing and multiple courses of antibiotics. Abscesses are smaller and less painful in the morning, worsening by evening. The current site is healed but remains itchy. They wish to reduce antibiotic use due to gastrointestinal side effects.  Abscesses occur every three to four months, with a recent decrease in frequency attributed to antibiotics. Shaving worsens skin irritation, so they consider trimming hair to reduce infection risk. Excessive sweating is managed with a spray-on deodorant to avoid skin irritation.  Hidradenitis suppurativa was first noted in middle school, requiring surgical intervention. They remain vigilant about monitoring for swelling. A whitish vaginal discharge with a strange smell is treated with Monistat, and they consume yogurt and probiotics to mitigate antibiotic side effects.       06/17/2023   11:15 AM 01/14/2023   10:00 AM 01/01/2023    8:24 AM  Depression screen PHQ 2/9  Decreased Interest 2 3 3   Down, Depressed, Hopeless 3 3 3   PHQ - 2 Score 5 6 6   Altered sleeping 3 3 3   Tired, decreased energy 3 3 3   Change in appetite 2 2 2   Feeling bad or failure about yourself  3 3 3    Trouble concentrating 2 3 2   Moving slowly or fidgety/restless 3 2 1   Suicidal thoughts 1 2 2   PHQ-9 Score 22 24 22   Difficult doing work/chores Very difficult Extremely dIfficult Very difficult      06/17/2023   11:16 AM 01/14/2023   10:01 AM 01/01/2023    8:24 AM  GAD 7 : Generalized Anxiety Score  Nervous, Anxious, on Edge 2 3 3   Control/stop worrying 3 3 2   Worry too much - different things 3 3 3   Trouble relaxing 2 3 3   Restless 2 2 2   Easily annoyed or irritable 3 3 3   Afraid - awful might happen 2 3 3   Total GAD 7 Score 17 20 19   Anxiety Difficulty Somewhat difficult Extremely difficult Extremely difficult    Medications: Outpatient Medications Prior to Visit  Medication Sig   albuterol  (VENTOLIN  HFA) 108 (90 Base) MCG/ACT inhaler Inhale 1-2 puffs into the lungs once every 4 to 6 hours as needed.   amoxicillin -clavulanate (AUGMENTIN ) 875-125 MG tablet Take 1 tablet by mouth 2 (two) times daily.   beclomethasone (QVAR ) 40 MCG/ACT inhaler Inhale into the lungs 2 (two) times daily.   budesonide -formoterol  (SYMBICORT ) 80-4.5 MCG/ACT inhaler Inhale 2 puffs into the lungs 2 (two) times daily.   cetirizine  (ZYRTEC ) 10 MG tablet Take 1 tablet (10 mg total) by mouth daily.   FLUoxetine  (PROZAC ) 10 MG capsule Take 1 capsule (10 mg total) by mouth daily.   Fluticasone  Furoate 100 MCG/ACT AEPB Inhale  1 puff (100 mcg total) into the lungs once daily. Do not exceed 1 dose per day.   montelukast  (SINGULAIR ) 10 MG tablet Take 1 tablet (10 mg total) by mouth at bedtime.   olopatadine (PATANOL) 0.1 % ophthalmic solution 1 drop into affected eye Ophthalmic Twice a day for 30 days   Olopatadine HCl 0.6 % SOLN Place into the nose.   No facility-administered medications prior to visit.    Review of Systems All negative Except see HPI       Objective    BP 110/61 (BP Location: Left Arm, Patient Position: Sitting, Cuff Size: Normal)   Pulse 74   Resp 16   Ht 5\' 3"  (1.6 m)   Wt 148  lb (67.1 kg)   SpO2 100%   BMI 26.22 kg/m     Physical Exam Constitutional:      General: Kerry Mcgee is not in acute distress.    Appearance: Normal appearance. Kerry Mcgee is not diaphoretic.  HENT:     Head: Normocephalic.  Eyes:     Conjunctiva/sclera: Conjunctivae normal.  Pulmonary:     Effort: Pulmonary effort is normal. No respiratory distress.  Musculoskeletal:        General: No swelling or tenderness.  Skin:    Findings: No bruising, lesion or rash.  Neurological:     Mental Status: Kerry Mcgee is alert and oriented to person, place, and time. Mental status is at baseline.      No results found for any visits on 07/20/23.      Assessment and Plan Assessment & Plan Hidradenitis Suppurativa Chronic condition with recurrent abscesses, currently non-painful with induration. Prefers to avoid frequent antibiotics due to gastrointestinal side effects. - Monitor for painful episodes and return for evaluation if symptoms worsen. - Recommend trimming hair in affected areas to reduce moisture and infection risk. - Encourage use of spray-on deodorant to minimize skin irritation.  Yeast Infection Possible yeast infection post-antibiotic use with whitish discharge. Partial relief with Monistat 3. - Order urinalysis for yeast infection. - Provide self-swab for vaginal sample collection. - Advise continuation of probiotics and yogurt.  General Health Maintenance Due for hepatitis C screening and needs vaccination status review. - Schedule hepatitis C screening. - Review vaccination records for pneumonia and COVID vaccines.  Vaginal discharge (Primary)  - Cervicovaginal ancillary only - POCT urinalysis dipstick  Dysuria  - Cervicovaginal ancillary only - POCT urinalysis dipstick  Vaginal discharge (Primary)  - Cervicovaginal ancillary only - POCT urinalysis dipstick  Dysuria  - Cervicovaginal ancillary only - POCT urinalysis dipstick  Moderate persistent asthma without  complication Chronic and stable, on albuterol  -albuterol  inhaler  Checked Rx, due to 11/2023 Pt should check with pharmacy Will fu  No complaints about anxiety/depression despite high scores.  No orders of the defined types were placed in this encounter.   No follow-ups on file.   The patient was advised to call back or seek an in-person evaluation if the symptoms worsen or if the condition fails to improve as anticipated.  I discussed the assessment and treatment plan with the patient. The patient was provided an opportunity to ask questions and all were answered. The patient agreed with the plan and demonstrated an understanding of the instructions.  I, Marcelline Temkin, PA-C have reviewed all documentation for this visit. The documentation on 07/20/2023  for the exam, diagnosis, procedures, and orders are all accurate and complete.  Blane Bunting, [facility], MMS [facility] [phone] (phone) [phone] (fax)  [facility]

## 2023-07-21 ENCOUNTER — Telehealth: Payer: Self-pay | Admitting: Physician Assistant

## 2023-07-21 DIAGNOSIS — F32A Depression, unspecified: Secondary | ICD-10-CM

## 2023-07-21 LAB — CERVICOVAGINAL ANCILLARY ONLY
Bacterial Vaginitis (gardnerella): NEGATIVE
Candida Glabrata: NEGATIVE
Candida Vaginitis: POSITIVE — AB
Comment: NEGATIVE
Comment: NEGATIVE
Comment: NEGATIVE

## 2023-07-21 NOTE — Telephone Encounter (Unsigned)
 Copied from CRM [id number]. Topic: Clinical - Medication Refill >> Jul 21, 2023 12:24 PM DeAngela L wrote: Medication: FLUoxetine  (PROZAC ) capsule The provider upped the dosage to 20 mg previously and so the pt was taking two pills and now the pharmacy wont refill her medication June 5th and the patient is out of medication   Has the patient contacted their pharmacy? Yes  (Agent: If no, request that the patient contact the pharmacy for the refill. If patient does not wish to contact the pharmacy document the reason why and proceed with request.) (Agent: If yes, when and what did the pharmacy advise?)  This is the patient's preferred pharmacy:   [id number] [place], [place] - [address] [place] [address] Phone: [phone] Fax: [phone]  Is this the correct pharmacy for this prescription? Yes  If no, delete pharmacy and type the correct one.   Has the prescription been filled recently? Yes   Is the patient out of the medication? Yes   Has the patient been seen for an appointment in the last year OR does the patient have an upcoming appointment? Yes   Can we respond through MyChart? Yes   Agent: Please be advised that Rx refills may take up to 3 business days. We ask that you follow-up with your pharmacy.

## 2023-07-22 ENCOUNTER — Other Ambulatory Visit: Payer: Self-pay

## 2023-07-22 ENCOUNTER — Ambulatory Visit: Payer: Self-pay | Admitting: Physician Assistant

## 2023-07-22 DIAGNOSIS — B3731 Acute candidiasis of vulva and vagina: Secondary | ICD-10-CM

## 2023-07-22 MED ORDER — FLUCONAZOLE 150 MG PO TABS
150.0000 mg | ORAL_TABLET | Freq: Every day | ORAL | 1 refills | Status: DC
  Filled 2023-07-22: qty 2, 2d supply, fill #0

## 2023-07-23 NOTE — Telephone Encounter (Signed)
 Requested medications are due for refill today.  See note  Requested medications are on the active medications list.  yes  Last refill. 11/26/2022 #90 3 rf  Future visit scheduled.   yes  Notes to clinic.  Medication: FLUoxetine  (PROZAC ) capsule The provider upped the dosage to 20 mg previously and so the pt was taking two pills and now the pharmacy wont refill her medication June 5th and the patient is out of medication    Requested Prescriptions  Pending Prescriptions Disp Refills   FLUoxetine  (PROZAC ) 10 MG capsule 90 capsule 3    Sig: Take 1 capsule (10 mg total) by mouth daily.     Psychiatry:  Antidepressants - SSRI Failed - 07/23/2023 12:34 PM      Failed - Valid encounter within last 6 months    Recent Outpatient Visits           3 days ago Vaginal discharge   [facility] [place], [place], PA-C   1 month ago Abscess of right axilla   [facility] [place], [place], PA-C       Future Appointments             In 5 months Ost[place]alt, Janna, PA-C [facility]            Passed - Completed PHQ-2 or PHQ-9 in the last 360 days

## 2023-08-02 ENCOUNTER — Other Ambulatory Visit: Payer: Self-pay

## 2023-09-28 ENCOUNTER — Telehealth: Payer: Self-pay

## 2023-09-28 DIAGNOSIS — F339 Major depressive disorder, recurrent, unspecified: Secondary | ICD-10-CM

## 2023-09-28 DIAGNOSIS — F419 Anxiety disorder, unspecified: Secondary | ICD-10-CM

## 2023-09-28 NOTE — Telephone Encounter (Signed)
 Copied from CRM #[id number]. Topic: Clinical - Prescription Issue >> Sep 28, 2023 11:05 AM Sophia H wrote: Reason for CRM: Patient states needs an updated prescription sent in for FLUoxetine  (PROZAC ) 10 MG capsule sent to the pharmacy. Has been taking 2 of the 10mg  and it has been running out faster, pharmacy won't fill for 20mg  without an updated RX. Please advise when done    [id number] [place], [place] - [address]

## 2023-09-29 ENCOUNTER — Other Ambulatory Visit: Payer: Self-pay

## 2023-09-29 MED ORDER — FLUOXETINE HCL 20 MG PO CAPS
20.0000 mg | ORAL_CAPSULE | Freq: Every day | ORAL | 0 refills | Status: DC
  Filled 2023-09-29: qty 30, 30d supply, fill #0

## 2023-09-29 NOTE — Telephone Encounter (Signed)
 Please, let pt know that she still needs to be seen for anxiety, depression on 09/30/23

## 2023-09-29 NOTE — Addendum Note (Signed)
 Addended by: Woodie Trusty on: 09/29/2023 06:06 PM   Modules accepted: Orders

## 2023-09-30 ENCOUNTER — Ambulatory Visit (INDEPENDENT_AMBULATORY_CARE_PROVIDER_SITE_OTHER): Admitting: Physician Assistant

## 2023-09-30 ENCOUNTER — Encounter: Payer: Self-pay | Admitting: Physician Assistant

## 2023-09-30 VITALS — BP 108/73 | HR 83 | Resp 14 | Ht 63.0 in | Wt 151.7 lb

## 2023-09-30 DIAGNOSIS — L732 Hidradenitis suppurativa: Secondary | ICD-10-CM | POA: Diagnosis not present

## 2023-09-30 DIAGNOSIS — L0291 Cutaneous abscess, unspecified: Secondary | ICD-10-CM | POA: Diagnosis not present

## 2023-09-30 NOTE — Telephone Encounter (Signed)
 Letter sent via mychart advising appointment due

## 2023-09-30 NOTE — Progress Notes (Signed)
 Established patient visit  Patient: Kerry Mcgee   DOB: 2001/01/19   23 y.o. Adult  MRN: [id number] Visit Date: 09/30/2023  Today's healthcare provider: Jolynn Spencer, PA-C   Chief Complaint  Patient presents with   Abscess    Reoccuring abscess on tailbone 3 months ago had procedure done it was lanced. Appeared 1 week ago.   Subjective     HPI     Abscess    Additional comments: Reoccuring abscess on tailbone 3 months ago had procedure done it was lanced. Appeared 1 week ago.      Last edited by Wilfred Hargis RAMAN, CMA on 09/30/2023 10:59 AM.       Discussed the use of AI scribe software for clinical note transcription with the patient, who gave verbal consent to proceed.  History of Present Illness Kerry Mcgee is a 23 year old who presents with a recurring issue on their elbow requiring surgical consultation.  The elbow issue has resurfaced a few weeks ago, causing significant pain, especially during local anesthetic administration. There is concern about the anesthetic's effectiveness and duration. A family history of similar elbow issues exists, as their father experienced a similar condition.  They are experiencing issues with their Prozac  prescription. Pt requested increased a dose to 20mg        09/30/2023   11:06 AM 07/20/2023   10:40 AM 06/17/2023   11:15 AM  Depression screen PHQ 2/9  Decreased Interest 3 0 2  Down, Depressed, Hopeless 2 1 3   PHQ - 2 Score 5 1 5   Altered sleeping 3 2 3   Tired, decreased energy 3 3 3   Change in appetite 1 2 2   Feeling bad or failure about yourself  2 1 3   Trouble concentrating 1 2 2   Moving slowly or fidgety/restless 2 1 3   Suicidal thoughts 1 0 1  PHQ-9 Score 18 12 22   Difficult doing work/chores Very difficult Somewhat difficult Very difficult      09/30/2023   11:07 AM 07/20/2023   10:40 AM 06/17/2023   11:16 AM 01/14/2023   10:01 AM  GAD 7 : Generalized Anxiety Score  Nervous, Anxious, on Edge 2 3 2 3    Control/stop worrying 1 1 3 3   Worry too much - different things 1 1 3 3   Trouble relaxing 2 2 2 3   Restless 1 0 2 2  Easily annoyed or irritable 3 1 3 3   Afraid - awful might happen 2 1 2 3   Total GAD 7 Score 12 9 17 20   Anxiety Difficulty Somewhat difficult Somewhat difficult Somewhat difficult Extremely difficult    Medications: Outpatient Medications Prior to Visit  Medication Sig   albuterol  (VENTOLIN  HFA) 108 (90 Base) MCG/ACT inhaler Inhale 1-2 puffs into the lungs once every 4 to 6 hours as needed.   amoxicillin -clavulanate (AUGMENTIN ) 875-125 MG tablet Take 1 tablet by mouth 2 (two) times daily.   beclomethasone (QVAR ) 40 MCG/ACT inhaler Inhale into the lungs 2 (two) times daily.   budesonide -formoterol  (SYMBICORT ) 80-4.5 MCG/ACT inhaler Inhale 2 puffs into the lungs 2 (two) times daily.   cetirizine  (ZYRTEC ) 10 MG tablet Take 1 tablet (10 mg total) by mouth daily.   fluconazole  (DIFLUCAN ) 150 MG tablet Take one tablet by mouth once then take second dose 72 hours after the first dose   FLUoxetine  (PROZAC ) 20 MG capsule Take 1 capsule (20 mg total) by mouth daily.   Fluticasone  Furoate 100 MCG/ACT AEPB Inhale 1 puff (100 mcg total)  into the lungs once daily. Do not exceed 1 dose per day.   montelukast  (SINGULAIR ) 10 MG tablet Take 1 tablet (10 mg total) by mouth at bedtime.   olopatadine (PATANOL) 0.1 % ophthalmic solution 1 drop into affected eye Ophthalmic Twice a day for 30 days   Olopatadine HCl 0.6 % SOLN Place into the nose.   No facility-administered medications prior to visit.    Review of Systems All negative Except see HPI       Objective    BP 108/73 (BP Location: Right Arm, Patient Position: Sitting, Cuff Size: Large)   Pulse 83   Resp 14   Ht 5' 3 (1.6 m)   Wt 151 lb 11.2 oz (68.8 kg)   LMP 09/23/2023   SpO2 97%   BMI 26.87 kg/m     Physical Exam Constitutional:      General: Kerry Mcgee is not in acute distress.    Appearance: Normal appearance.   HENT:     Head: Normocephalic.  Pulmonary:     Effort: Pulmonary effort is normal. No respiratory distress.  Skin:    Findings: Lesion (intergluteal cleft) present.  Neurological:     Mental Status: Kerry Mcgee is alert and oriented to person, place, and time. Mental status is at baseline.      No results found for any visits on 09/30/23.      Assessment & Plan Recurrent abscess of right gluteal fold previously treated with lancing and antibiotics, returned. Causes distress due to upcoming college classes. Inadequate response to local anesthetics previously led to severe pain. Antibiotics caused gastrointestinal distress. - Refer to surgeon for evaluation and potential surgical intervention. - Advise contacting previous surgeon, Hershal Pierre, for continuity of care. - Discuss potential use of antibiotics if symptoms worsen, with caution due to previous gastrointestinal side effects. - Encourage communication with the surgeon regarding the return of the abscess. Will follow-up  Major depressive disorder Ongoing management with Prozac . Recent pharmacy issues with incorrect dosage led to out-of-pocket expenses and medication access problems. - Ensure pharmacy dispenses correct dosage of Prozac  (20 mg). - Advise sending a message if further issues arise with pharmacy. - Schedule follow-up in November for comprehensive evaluation.  Will follow-up in 3 mo  Orders Placed This Encounter  Procedures   Ambulatory referral to General Surgery    Referral Priority:   Routine    Referral Type:   Surgical    Referral Reason:   Specialty Services Required    Requested Specialty:   General Surgery    Number of Visits Requested:   1    No follow-ups on file.   The patient was advised to call back or seek an in-person evaluation if the symptoms worsen or if the condition fails to improve as anticipated.  I discussed the assessment and treatment plan with the patient. The patient was provided an  opportunity to ask questions and all were answered. The patient agreed with the plan and demonstrated an understanding of the instructions.  I, Jerral Mccauley, PA-C have reviewed all documentation for this visit. The documentation on 09/30/2023  for the exam, diagnosis, procedures, and orders are all accurate and complete.  Jolynn Spencer, [facility], MMS [facility] [phone] (phone) [phone] (fax)  [facility]

## 2023-10-01 ENCOUNTER — Encounter: Payer: Self-pay | Admitting: Physician Assistant

## 2023-10-04 ENCOUNTER — Other Ambulatory Visit: Payer: Self-pay

## 2023-10-04 ENCOUNTER — Ambulatory Visit: Payer: Self-pay

## 2023-10-04 ENCOUNTER — Telehealth: Payer: Self-pay | Admitting: Physician Assistant

## 2023-10-04 DIAGNOSIS — F339 Major depressive disorder, recurrent, unspecified: Secondary | ICD-10-CM

## 2023-10-04 DIAGNOSIS — F419 Anxiety disorder, unspecified: Secondary | ICD-10-CM

## 2023-10-04 MED ORDER — FLUOXETINE HCL 20 MG PO CAPS: 20.0000 mg | ORAL_CAPSULE | Freq: Every day | ORAL | 0 refills | Status: DC

## 2023-10-04 NOTE — Telephone Encounter (Signed)
 FYI Only or Action Required?: Action required by provider: medication refill request.  Patient was last seen in primary care on Kerry Mcgee 09/30/23.  Called Nurse Triage reporting mood changes.  Symptoms began several days ago.  Interventions attempted: Nothing.  Symptoms are: unchanged.  Triage Disposition: Home Care  Patient/caregiver understands and will follow disposition?: Yes      Copied from CRM [id number]. Topic: Clinical - Red Word Triage >> Oct 04, 2023  1:58 PM Sasha H wrote: Red Word that prompted transfer to Nurse Triage:  Pt states her FLUoxetine  (PROZAC ) 20 MG capsule was sent over to the wrong pharmacy and she sent a message over and it has been days and she hasn't heard back and now since she has been off her meds, it is starting to affect her daily life. Pt states her mood is unstable and she has been off of it 3 days she's sure of but maybe longer, crying spells and being angry Reason for Disposition  MILD anxiety symptoms (e.g., anxiety symptoms are mild and intermittent; symptoms do not interfere with daily activities)  Answer Assessment - Initial Assessment Questions 1. CONCERN: Did anything happen that prompted you to call today?      Out of medications for a couple days 2. ANXIETY SYMPTOMS: Can you describe how you (your loved one; patient) have been feeling? (e.g., tense, restless, panicky, anxious, keyed up, overwhelmed, sense of impending doom).      Sensitive about things  3. ONSET: How long have you been feeling this way? (e.g., hours, days, weeks)     A couple days 4. SEVERITY: How would you rate the level of anxiety? (e.g., 0 - 10; or mild, moderate, severe).     mild 5. FUNCTIONAL IMPAIRMENT: How have these feelings affected your ability to do daily activities? Have you had more difficulty than usual doing your normal daily activities? (e.g., getting better, same, worse; self-care, school, work, interactions)     Hard to interact with ppl  due to not wanting to snap at ppl so they stay home  6. HISTORY: Have you felt this way before? Have you ever been diagnosed with an anxiety problem in the past? (e.g., generalized anxiety disorder, panic attacks, PTSD). If Yes, ask: How was this problem treated? (e.g., medicines, counseling, etc.)     yes 7. RISK OF HARM - SUICIDAL IDEATION: Do you ever have thoughts of hurting or killing yourself? If Yes, ask:  Do you have these feelings now? Do you have a plan on how you would do this?     no 8. TREATMENT:  What has been done so far to treat this anxiety? (e.g., medicines, relaxation strategies). What has helped?     Medication 9. THERAPIST: Do you have a counselor or therapist? If Yes, ask: What is their name?     Not anymore due to insurance 10. POTENTIAL TRIGGERS: Do you drink caffeinated beverages (e.g., coffee, colas, teas), and how much daily? Do you drink alcohol or use any drugs? Have you started any new medicines recently?       no 11. PATIENT SUPPORT: Who is with you now? Who do you live with? Do you have family or friends who you can talk to?        yes 12. OTHER SYMPTOMS: Do you have any other symptoms? (e.g., feeling depressed, trouble concentrating, trouble sleeping, trouble breathing, palpitations or fast heartbeat, chest pain, sweating, nausea, or diarrhea)       Crying spells and angry  and mood swings  Protocols used: Anxiety and Panic Attack-A-AH

## 2023-10-05 ENCOUNTER — Ambulatory Visit: Payer: Self-pay | Admitting: Surgery

## 2023-10-05 NOTE — H&P (Signed)
 Subjective:   CC: Pilonidal abscess [L05.01]  HPI:  Kerry Mcgee is a 23 y.o. female returns for evaluation of above. Had another episode recently but has since resolved.  Ready for formal excision.   Past Medical History:  has a past medical history of Anxiety, Asthma, unspecified asthma severity, unspecified whether complicated, unspecified whether persistent (HHS-HCC), and Depression.  Past Surgical History:  has a past surgical history that includes incision & drainage abscess.  Family History: family history is not on file.  Social History:  reports that she has never smoked. She has never been exposed to tobacco smoke. She has never used smokeless tobacco. She reports that she does not currently use alcohol. She reports that she does not use drugs.  Current Medications: has a current medication list which includes the following prescription(s): albuterol , beclomethasone dipropionate , cetirizine , fluoxetine , fluticasone  propionate, montelukast , olopatadine, and tramadol.  Allergies:  Allergies  Allergen Reactions   Sulfa (Sulfonamide Antibiotics) Hives   Mite Extract Other (See Comments)    Wheezing   Shellfish Containing Products Other (See Comments)    Allergy tested - tested positive    ROS:  A 15 point review of systems was performed and pertinent positives and negatives noted in HPI   Objective:     BP 115/77   Pulse 77   Ht 157.5 cm (5' 2)   Wt 68 kg (150 lb)   LMP 09/21/2023   BMI 27.44 kg/m   Constitutional :  No distress, cooperative, alert  Lymphatics/Throat:  Supple with no lymphadenopathy  Respiratory:  Clear to auscultation bilaterally  Cardiovascular:  Regular rate and rhythm  Gastrointestinal: Soft, non-tender, non-distended, no organomegaly.  Musculoskeletal: Steady gait and movement  Skin: Cool and moist, healed gluteal abscess on left side at cleft.  Now has developing midline pits visible  Psychiatric: Normal affect, non-agitated, not  confused         LABS:  N/a   RADS: N/a  Assessment:      Pilonidal abscess [L05.01]  Plan:     1. Pilonidal abscess [L05.01] -ready for excision now. Discussed definitive surgical excision.  Alternatives include continued observation.  Benefits include possible symptom relief, pathologic evaluation, improved cosmesis. Discussed the risk of surgery including recurrence, chronic pain, post-op infxn, poor cosmesis, poor/delayed wound healing, and possible re-operation to address said risks. The risks of general anesthetic, if used, includes MI, CVA, sudden death or even reaction to anesthetic medications also discussed.  Typical post-op recovery time of 3-5 days with possible activity restrictions were also discussed.  The patient verbalized understanding and all questions were answered to the patient's satisfaction. Will schedule   labs/images/medications/previous chart entries reviewed personally and relevant changes/updates noted above.

## 2023-10-06 NOTE — Telephone Encounter (Signed)
 medication

## 2023-11-30 ENCOUNTER — Encounter: Payer: Self-pay | Admitting: Urgent Care

## 2023-12-03 ENCOUNTER — Inpatient Hospital Stay: Admission: RE | Admit: 2023-12-03 | Source: Ambulatory Visit

## 2023-12-10 ENCOUNTER — Ambulatory Visit: Admit: 2023-12-10 | Admitting: Surgery

## 2023-12-10 SURGERY — EXCISION, SIMPLE PILONIDAL CYST
Anesthesia: General

## 2023-12-27 ENCOUNTER — Other Ambulatory Visit: Payer: Self-pay | Admitting: Physician Assistant

## 2023-12-27 DIAGNOSIS — J302 Other seasonal allergic rhinitis: Secondary | ICD-10-CM

## 2023-12-27 DIAGNOSIS — J4540 Moderate persistent asthma, uncomplicated: Secondary | ICD-10-CM

## 2023-12-30 ENCOUNTER — Ambulatory Visit: Payer: Self-pay | Admitting: Surgery

## 2023-12-30 NOTE — H&P (Signed)
 Subjective:   CC: Pilonidal abscess [L05.01]  HPI:  Kerry Mcgee is a 23 y.o. female returns for evaluation of above. Had another episode recently but has since resolved.  Ready for formal excision.   Past Medical History:  has a past medical history of Anxiety, Asthma, unspecified asthma severity, unspecified whether complicated, unspecified whether persistent (HHS-HCC), and Depression.  Past Surgical History:  has a past surgical history that includes incision & drainage abscess.  Family History: family history is not on file.  Social History:  reports that she has never smoked. She has never been exposed to tobacco smoke. She has never used smokeless tobacco. She reports that she does not currently use alcohol. She reports that she does not use drugs.  Current Medications: has a current medication list which includes the following prescription(s): albuterol , beclomethasone dipropionate , cetirizine , fluoxetine , fluticasone  propionate, montelukast , olopatadine, and tramadol.  Allergies:  Allergies  Allergen Reactions   Sulfa (Sulfonamide Antibiotics) Hives   Mite Extract Other (See Comments)    Wheezing   Shellfish Containing Products Other (See Comments)    Allergy tested - tested positive    ROS:  A 15 point review of systems was performed and pertinent positives and negatives noted in HPI   Objective:     BP 115/77   Pulse 77   Ht 157.5 cm (5' 2)   Wt 68 kg (150 lb)   LMP 09/21/2023   BMI 27.44 kg/m   Constitutional :  No distress, cooperative, alert  Lymphatics/Throat:  Supple with no lymphadenopathy  Respiratory:  Clear to auscultation bilaterally  Cardiovascular:  Regular rate and rhythm  Gastrointestinal: Soft, non-tender, non-distended, no organomegaly.  Musculoskeletal: Steady gait and movement  Skin: Cool and moist, healed gluteal abscess on left side at cleft.  Now has developing midline pits visible  Psychiatric: Normal affect, non-agitated, not  confused         LABS:  N/a   RADS: N/a  Assessment:      Pilonidal abscess [L05.01]  Plan:     1. Pilonidal abscess [L05.01] -ready for excision now. Discussed definitive surgical excision.  Alternatives include continued observation.  Benefits include possible symptom relief, pathologic evaluation, improved cosmesis. Discussed the risk of surgery including recurrence, chronic pain, post-op infxn, poor cosmesis, poor/delayed wound healing, and possible re-operation to address said risks. The risks of general anesthetic, if used, includes MI, CVA, sudden death or even reaction to anesthetic medications also discussed.  Typical post-op recovery time of 3-5 days with possible activity restrictions were also discussed.  The patient verbalized understanding and all questions were answered to the patient's satisfaction. Will schedule   labs/images/medications/previous chart entries reviewed personally and relevant changes/updates noted above.

## 2023-12-30 NOTE — H&P (View-Only) (Signed)
 Subjective:   CC: Pilonidal abscess [L05.01]  HPI:  Kerry Mcgee is a 23 y.o. female returns for evaluation of above. Had another episode recently but has since resolved.  Ready for formal excision.   Past Medical History:  has a past medical history of Anxiety, Asthma, unspecified asthma severity, unspecified whether complicated, unspecified whether persistent (HHS-HCC), and Depression.  Past Surgical History:  has a past surgical history that includes incision & drainage abscess.  Family History: family history is not on file.  Social History:  reports that she has never smoked. She has never been exposed to tobacco smoke. She has never used smokeless tobacco. She reports that she does not currently use alcohol. She reports that she does not use drugs.  Current Medications: has a current medication list which includes the following prescription(s): albuterol , beclomethasone dipropionate , cetirizine , fluoxetine , fluticasone  propionate, montelukast , olopatadine, and tramadol.  Allergies:  Allergies  Allergen Reactions   Sulfa (Sulfonamide Antibiotics) Hives   Mite Extract Other (See Comments)    Wheezing   Shellfish Containing Products Other (See Comments)    Allergy tested - tested positive    ROS:  A 15 point review of systems was performed and pertinent positives and negatives noted in HPI   Objective:     BP 115/77   Pulse 77   Ht 157.5 cm (5' 2)   Wt 68 kg (150 lb)   LMP 09/21/2023   BMI 27.44 kg/m   Constitutional :  No distress, cooperative, alert  Lymphatics/Throat:  Supple with no lymphadenopathy  Respiratory:  Clear to auscultation bilaterally  Cardiovascular:  Regular rate and rhythm  Gastrointestinal: Soft, non-tender, non-distended, no organomegaly.  Musculoskeletal: Steady gait and movement  Skin: Cool and moist, healed gluteal abscess on left side at cleft.  Now has developing midline pits visible  Psychiatric: Normal affect, non-agitated, not  confused         LABS:  N/a   RADS: N/a  Assessment:      Pilonidal abscess [L05.01]  Plan:     1. Pilonidal abscess [L05.01] -ready for excision now. Discussed definitive surgical excision.  Alternatives include continued observation.  Benefits include possible symptom relief, pathologic evaluation, improved cosmesis. Discussed the risk of surgery including recurrence, chronic pain, post-op infxn, poor cosmesis, poor/delayed wound healing, and possible re-operation to address said risks. The risks of general anesthetic, if used, includes MI, CVA, sudden death or even reaction to anesthetic medications also discussed.  Typical post-op recovery time of 3-5 days with possible activity restrictions were also discussed.  The patient verbalized understanding and all questions were answered to the patient's satisfaction. Will schedule   labs/images/medications/previous chart entries reviewed personally and relevant changes/updates noted above.

## 2024-01-02 ENCOUNTER — Other Ambulatory Visit: Payer: Self-pay | Admitting: Physician Assistant

## 2024-01-02 DIAGNOSIS — F339 Major depressive disorder, recurrent, unspecified: Secondary | ICD-10-CM

## 2024-01-02 DIAGNOSIS — F419 Anxiety disorder, unspecified: Secondary | ICD-10-CM

## 2024-01-03 ENCOUNTER — Inpatient Hospital Stay
Admission: RE | Admit: 2024-01-03 | Discharge: 2024-01-03 | Disposition: A | Discharge status: Home / Self Care | Source: Ambulatory Visit

## 2024-01-03 ENCOUNTER — Ambulatory Visit (INDEPENDENT_AMBULATORY_CARE_PROVIDER_SITE_OTHER): Payer: Self-pay | Admitting: Physician Assistant

## 2024-01-03 ENCOUNTER — Ambulatory Visit: Payer: Self-pay | Admitting: Physician Assistant

## 2024-01-03 ENCOUNTER — Encounter: Payer: Self-pay | Admitting: Physician Assistant

## 2024-01-03 VITALS — BP 119/67 | HR 75 | Resp 14 | Ht 63.0 in | Wt 155.1 lb

## 2024-01-03 DIAGNOSIS — J4520 Mild intermittent asthma, uncomplicated: Secondary | ICD-10-CM

## 2024-01-03 DIAGNOSIS — Z23 Encounter for immunization: Secondary | ICD-10-CM

## 2024-01-03 DIAGNOSIS — Z0001 Encounter for general adult medical examination with abnormal findings: Secondary | ICD-10-CM | POA: Diagnosis not present

## 2024-01-03 DIAGNOSIS — R0683 Snoring: Secondary | ICD-10-CM | POA: Diagnosis not present

## 2024-01-03 DIAGNOSIS — R0981 Nasal congestion: Secondary | ICD-10-CM

## 2024-01-03 DIAGNOSIS — R5383 Other fatigue: Secondary | ICD-10-CM | POA: Diagnosis not present

## 2024-01-03 DIAGNOSIS — Z0000 Encounter for general adult medical examination without abnormal findings: Secondary | ICD-10-CM | POA: Insufficient documentation

## 2024-01-03 HISTORY — DX: Pilonidal cyst without abscess: L05.91

## 2024-01-03 HISTORY — DX: Hidradenitis suppurativa: L73.2

## 2024-01-03 LAB — POC COVID19 BINAXNOW: SARS Coronavirus 2 Ag: NEGATIVE

## 2024-01-03 NOTE — Progress Notes (Signed)
 Complete physical exam  Patient: Kerry Mcgee   DOB: [birth year]   22 y.o. Adult  MRN: [id number] Visit Date: 01/03/2024  Today's healthcare provider: Jolynn Spencer, PA-C   Chief Complaint  Patient presents with   Annual Exam   Subjective    Kerry Mcgee is a 23 y.o. adult who presents today for a complete physical exam.   Discussed the use of AI scribe software for clinical note transcription with the patient, who gave verbal consent to proceed.  History of Present Illness Kerry Mcgee is a 23 year old with asthma and allergies who presents with nasal congestion and concerns about upcoming surgery.  Nasal congestion and post-nasal drip persist despite daily use of Flonase and cetirizine , indicating poorly controlled allergies. Asthma is managed with as-needed albuterol  and regular montelukast , with occasional wheezing but no current cough, sore throat, or fever. They are scheduled for surgery for a pilonidal cyst and are concerned about the impact of nasal congestion on the procedure. They had contact with a COVID-19 positive individual over a week ago but have no symptoms and have avoided further contact.    Last depression screening scores    01/03/2024    9:12 AM 09/30/2023   11:06 AM 07/20/2023   10:40 AM  PHQ 2/9 Scores  PHQ - 2 Score 3 5 1   PHQ- 9 Score 12 18  12       Data saved with a previous flowsheet row definition   Last fall risk screening    09/30/2023   11:03 AM  Fall Risk   Falls in the past year? 0  Number falls in past yr: 0  Injury with Fall? 0  Risk for fall due to : No Fall Risks   Last Audit-C alcohol use screening    09/30/2023   11:01 AM  Alcohol Use Disorder Test (AUDIT)  1. How often do you have a drink containing alcohol? 1  2. How many drinks containing alcohol do you have on a typical day when you are drinking? 0  3. How often do you have six or more drinks on one occasion? 0  AUDIT-C Score 1   A score of 3 or more  in women, and 4 or more in men indicates increased risk for alcohol abuse, EXCEPT if all of the points are from question 1   Past Medical History:  Diagnosis Date   Allergy    Anxiety    Asthma    Depression    History reviewed. No pertinent surgical history. Social History   Socioeconomic History   Marital status: Single    Spouse name: Not on file   Number of children: Not on file   Years of education: Not on file   Highest education level: Not on file  Occupational History   Not on file  Tobacco Use   Smoking status: Never   Smokeless tobacco: Never  Vaping Use   Vaping status: Never Used  Substance and Sexual Activity   Alcohol use: Yes    Alcohol/week: 2.0 - 3.0 standard drinks of alcohol    Types: 2 - 3 Cans of beer per week   Drug use: No   Sexual activity: Not on file  Other Topics Concern   Not on file  Social History Narrative   Not on file   Social Drivers of Health   Financial Resource Strain: Low Risk  (09/30/2023)   Overall Financial Resource Strain (CARDIA)  Difficulty of Paying Living Expenses: Not very hard  Food Insecurity: No Food Insecurity (09/30/2023)   Hunger Vital Sign    Worried About Running Out of Food in the Last Year: Never true    Ran Out of Food in the Last Year: Never true  Transportation Needs: No Transportation Needs (09/30/2023)   PRAPARE - Administrator, Civil Service (Medical): No    Lack of Transportation (Non-Medical): No  Physical Activity: Insufficiently Active (09/30/2023)   Exercise Vital Sign    Days of Exercise per Week: 2 days    Minutes of Exercise per Session: 40 min  Stress: No Stress Concern Present (09/30/2023)   [employer] of Occupational [employer] - Occupational Stress Questionnaire    Feeling of Stress: Not at all  Social Connections: Not on file  Intimate Partner Violence: Not At Risk (09/30/2023)   Humiliation, Afraid, Rape, and Kick questionnaire    Fear of Current or Ex-Partner: No     Emotionally Abused: No    Physically Abused: No    Sexually Abused: No   No family status information on file.   History reviewed. No pertinent family history. Allergies  Allergen Reactions   Dust Mite Extract Other (See Comments)    Wheezing    Other Other (See Comments)    Cats - wheezing   Shellfish Allergy Other (See Comments)    Allergy tested - tested positive   Sulfa Antibiotics Rash    Patient Care Team: Kerry Minotti, PA-C as PCP - General (Physician Assistant) Sankar, Seeplaputhur G, MD (General Surgery) Kerry Rolland SAILOR, MD as Referring Physician (Pediatrics)   Medications: Outpatient Medications Prior to Visit  Medication Sig   albuterol  (VENTOLIN  HFA) 108 (90 Base) MCG/ACT inhaler Inhale 1-2 puffs into the lungs once every 4 to 6 hours as needed.   cetirizine  (ZYRTEC ) 10 MG tablet Take 1 tablet (10 mg total) by mouth daily. (Patient taking differently: Take 10 mg by mouth at bedtime.)   ferrous sulfate 325 (65 FE) MG tablet Take 162.5 mg by mouth daily.   FLUoxetine  (PROZAC ) 20 MG capsule Take 1 capsule (20 mg total) by mouth daily.   fluticasone  (FLONASE) 50 MCG/ACT nasal spray Place 2 sprays into both nostrils in the morning and at bedtime.   Lactobacillus (ACIDOPHILUS PO) Take 1 capsule by mouth daily.   montelukast  (SINGULAIR ) 10 MG tablet TAKE 1 TABLET BY MOUTH AT BEDTIME   olopatadine (PATANOL) 0.1 % ophthalmic solution Place 1 drop into both eyes 2 (two) times daily as needed for allergies.   [DISCONTINUED] fluconazole  (DIFLUCAN ) 150 MG tablet Take one tablet by mouth once then take second dose 72 hours after the first dose (Patient not taking: Reported on 11/30/2023)   No facility-administered medications prior to visit.    Review of Systems  All other systems reviewed and are negative.  Except see HPI     Objective    BP 119/67   Pulse 75   Resp 14   Ht 5' 3 (1.6 m)   Wt 155 lb 1.6 oz (70.4 kg)   LMP 12/13/2023   SpO2 100%   BMI 27.47  kg/m      Physical Exam Vitals reviewed.  Constitutional:      General: Kerry Mcgee is not in acute distress.    Appearance: Normal appearance. Kerry Mcgee is well-developed. Kerry Mcgee is not ill-appearing, toxic-appearing or diaphoretic.  HENT:     Head: Normocephalic and atraumatic.     Right Ear: Tympanic membrane, ear canal  and external ear normal.     Left Ear: Tympanic membrane, ear canal and external ear normal.     Nose: Nose normal. No congestion or rhinorrhea.     Mouth/Throat:     Mouth: Mucous membranes are moist.     Pharynx: Oropharynx is clear. No oropharyngeal exudate.  Eyes:     General: No scleral icterus.       Right eye: No discharge.        Left eye: No discharge.     Conjunctiva/sclera: Conjunctivae normal.     Pupils: Pupils are equal, round, and reactive to light.  Neck:     Thyroid : No thyromegaly.     Vascular: No carotid bruit.  Cardiovascular:     Rate and Rhythm: Normal rate and regular rhythm.     Pulses: Normal pulses.     Heart sounds: Normal heart sounds. No murmur heard.    No friction rub. No gallop.  Pulmonary:     Effort: Pulmonary effort is normal. No respiratory distress.     Breath sounds: Normal breath sounds. No wheezing or rales.  Abdominal:     General: Abdomen is flat. Bowel sounds are normal. There is no distension.     Palpations: Abdomen is soft. There is no mass.     Tenderness: There is no abdominal tenderness. There is no right CVA tenderness, left CVA tenderness, guarding or rebound.     Hernia: No hernia is present.  Musculoskeletal:        General: No swelling, tenderness, deformity or signs of injury. Normal range of motion.     Cervical back: Normal range of motion and neck supple. No rigidity or tenderness.     Right lower leg: No edema.     Left lower leg: No edema.  Lymphadenopathy:     Cervical: No cervical adenopathy.  Skin:    General: Skin is warm and dry.     Coloration: Skin is not jaundiced or pale.     Findings: No  bruising, erythema, lesion or rash.  Neurological:     Mental Status: Kerry Mcgee is alert and oriented to person, place, and time. Mental status is at baseline.     Gait: Gait normal.  Psychiatric:        Mood and Affect: Mood normal.        Behavior: Behavior normal.        Thought Content: Thought content normal.        Judgment: Judgment normal.      No results found for any visits on 01/03/24.  Assessment & Plan    Routine Health Maintenance and Physical Exam  Exercise Activities and Dietary recommendations  Goals   None     Immunization History  Administered Date(s) Administered   DTaP 04/07/2001, 06/08/2001, 08/05/2001, 05/30/2002, 06/29/2006   HIB (PRP-OMP) 04/07/2001, 06/08/2001, 08/05/2001, 05/30/2002   HPV 9-valent 10/05/2013, 05/29/2015   Hepatitis A, Ped/Adol-2 Dose 06/29/2006, 09/22/2012   Hepatitis B, PED/ADOLESCENT 2001-01-22, 03/09/2001, 11/23/2001   IPV 04/07/2001, 06/08/2001, 11/23/2001, 06/29/2006   Influenza, Seasonal, Injecte, Preservative Fre 11/26/2022   MMR 02/06/2002, 06/29/2006   Meningococcal B, OMV 04/26/2018, 12/23/2018   Meningococcal Mcv4o 09/22/2012, 03/04/2017   PFIZER(Purple Top)SARS-COV-2 Vaccination 06/03/2019, 06/28/2019, 02/29/2020   Pneumococcal Conjugate PCV 7 04/07/2001, 06/08/2001, 08/05/2001, 02/06/2002   Tdap 09/22/2012, 01/01/2023   Varicella 02/06/2002, 09/22/2012    Health Maintenance  Topic Date Due   Hepatitis C Screening  Never done   Pneumococcal Vaccine (1 of 2 - PCV)  02/04/2020   Flu Shot  09/24/2023   COVID-19 Vaccine (4 - 2025-26 season) 10/25/2023   Pap Smear  01/13/2026   DTaP/Tdap/Td vaccine (8 - Td or Tdap) 12/31/2032   Hepatitis B Vaccine  Completed   HPV Vaccine  Completed   HIV Screening  Completed   Meningitis B Vaccine  Completed    Discussed health benefits of physical activity, and encouraged Kerry Mcgee to engage in regular exercise appropriate for Kerry Mcgee's age and condition.   Assessment & Plan Nasal  congestion and postnasal drip due to allergic rhinitis Nasal congestion and postnasal drip. Current use of Flonase and cetirizine . Concerns about potential impact on upcoming surgery. - Use nasal saline rinse spray before Flonase. - Continue Flonase daily. - Continue cetirizine  daily. - Discuss nasal congestion with anesthesiologist. Will follow-up  Asthma Occasional wheezing. Uses albuterol  as needed and montelukast  daily. Concerns about potential impact on upcoming surgery. - Continue albuterol  as needed. - Continue montelukast  daily. - Discuss asthma with anesthesiologist.  Planned surgery for pilonidal cyst First experience with general anesthesia. Concerns about past issues with anesthesia. No current symptoms of COVID, but recent exposure to a mildly symptomatic individual. - Discuss nasal congestion and asthma with anesthesiologist.   Immunization due (Primary)  - Flu vaccine trivalent PF, 6mos and older(Flulaval,Afluria,Fluarix,Fluzone)   Other fatigue Chronic  Workup ordered - Lipid panel - CBC with Differential/Platelet - Basic metabolic panel with GFR - Ambulatory referral to Sleep Studies - TSH + free T4 - POC COVID-19 Will reassess after  receiving lab results  Snoring In the past, was referred to sleep studies. ESS score 19 - Ambulatory referral to Sleep Studies   Annual physical exam Adult wellness visit Things to do to keep yourself healthy  - Exercise at least 30-45 minutes a day, 3-4 days a week.  - Eat a low-fat diet with lots of fruits and vegetables, up to 7-9 servings per day.  - Seatbelts can save your life. Wear them always.  - Smoke detectors on every level of your home, check batteries every year.  - Eye Doctor - have an eye exam every 1-2 years  - Safe sex - if you may be exposed to STDs, use a condom.  - Alcohol -  If you drink, do it moderately, less than 2 drinks per day.  - Health Care Power of Attorney. Choose someone to speak for you  if you are not able.  - Depression is common in our stressful world.If you're feeling down or losing interest in things you normally enjoy, please come in for a visit.  - Violence - If anyone is threatening or hurting you, please call immediately.  No follow-ups on file.    The patient was advised to call back or seek an in-person evaluation if the symptoms worsen or if the condition fails to improve as anticipated.  I discussed the assessment and treatment plan with the patient. The patient was provided an opportunity to ask questions and all were answered. The patient agreed with the plan and demonstrated an understanding of the instructions.  I, Leonel Mccollum, PA-C have reviewed all documentation for this visit. The documentation on 01/03/2024  for the exam, diagnosis, procedures, and orders are all accurate and complete.  Kerry Mcgee, [facility], MMS [facility] [phone] (phone) [phone] (fax)  [facility]

## 2024-01-03 NOTE — Patient Instructions (Addendum)
 Your procedure is scheduled on:01-07-24 Friday Report to the Registration Desk on the 1st floor of the [facility] Mall.Then proceed to the 2nd floor Surgery Desk To find out your arrival time, please call [phone] between 1PM - 3PM on:01-06-24 Thursday If your arrival time is 6:00 am, do not arrive before that time as the [facility] entrance doors do not open until 6:00 am.  REMEMBER: Instructions that are not followed completely may result in serious medical risk, up to and including death; or upon the discretion of your surgeon and anesthesiologist your surgery may need to be rescheduled.  Do not eat food after midnight the night before surgery.  No gum chewing or hard candies.  You may however, drink CLEAR liquids up to 2 hours before you are scheduled to arrive for your surgery. Do not drink anything within 2 hours of your scheduled arrival time.  Clear liquids include: - water  - apple juice without pulp - gatorade (not RED colors) - black coffee or tea (Do NOT add milk or creamers to the coffee or tea) Do NOT drink anything that is not on this list.  One week prior to surgery:Stop NOW (01-03-24) Stop Anti-inflammatories (NSAIDS) such as Advil, Aleve, Ibuprofen, Motrin, Naproxen, Naprosyn and Aspirin based products such as Excedrin, Goody's Powder, BC Powder. Stop ANY OVER THE COUNTER supplements until after surgery (Ferrous Sulfate, Probiotic)  You may however, continue to take Tylenol if needed for pain up until the day of surgery.  Continue taking all of your other prescription medications up until the day of surgery.  ON THE DAY OF SURGERY ONLY TAKE THESE MEDICATIONS WITH SIPS OF WATER: -FLUoxetine  (PROZAC )   No Alcohol for 24 hours before or after surgery.  No Smoking including e-cigarettes for 24 hours before surgery.  No chewable tobacco products for at least 6 hours before surgery.  No nicotine patches on the day of surgery.  Do not use any recreational drugs  for at least a week (preferably 2 weeks) before your surgery.  Please be advised that the combination of cocaine and anesthesia may have negative outcomes, up to and including death. If you test positive for cocaine, your surgery will be cancelled.  On the morning of surgery brush your teeth with toothpaste and water, you may rinse your mouth with mouthwash if you wish. Do not swallow any toothpaste or mouthwash.  Use CHG Soap as directed on instruction sheet.  Do not wear jewelry, make-up, hairpins, clips or nail polish.  For welded (permanent) jewelry: bracelets, anklets, waist bands, etc.  Please have this removed prior to surgery.  If it is not removed, there is a chance that hospital personnel will need to cut it off on the day of surgery.  Do not wear lotions, powders, or perfumes.   Do not shave body hair from the neck down 48 hours before surgery.  Contact lenses, hearing aids and dentures may not be worn into surgery.  Do not bring valuables to the hospital. [facility] is not responsible for any missing/lost belongings or valuables.   Notify your doctor if there is any change in your medical condition (cold, fever, infection).  Wear comfortable clothing (specific to your surgery type) to the hospital.  After surgery, you can help prevent lung complications by doing breathing exercises.  Take deep breaths and cough every 1-2 hours. Your doctor may order a device called an Incentive Spirometer to help you take deep breaths. When coughing or sneezing, hold a pillow firmly  against your incision with both hands. This is called "splinting." Doing this helps protect your incision. It also decreases belly discomfort.  If you are being admitted to the hospital overnight, leave your suitcase in the car. After surgery it may be brought to your room.  In case of increased patient census, it may be necessary for you, the patient, to continue your postoperative care in the Same Day Surgery  department.  If you are being discharged the day of surgery, you will not be allowed to drive home. You will need a responsible individual to drive you home and stay with you for 24 hours after surgery.   If you are taking public transportation, you will need to have a responsible individual with you.  Please call the Pre-admissions Testing Dept. at [phone] if you have any questions about these instructions.  Surgery Visitation Policy:  Patients having surgery or a procedure may have two visitors.  Children under the age of 4 must have an adult with them who is not the patient.                                                                                                             Preparing for Surgery with CHLORHEXIDINE GLUCONATE (CHG) Soap  Chlorhexidine Gluconate (CHG) Soap  o An antiseptic cleaner that kills germs and bonds with the skin to continue killing germs even after washing  o Used for showering the night before surgery and morning of surgery  Before surgery, you can play an important role by reducing the number of germs on your skin.  CHG (Chlorhexidine gluconate) soap is an antiseptic cleanser which kills germs and bonds with the skin to continue killing germs even after washing.  Please do not use if you have an allergy to CHG or antibacterial soaps. If your skin becomes reddened/irritated stop using the CHG.  1. Shower the NIGHT BEFORE SURGERY with CHG soap.  2. If you choose to wash your hair, wash your hair first as usual with your normal shampoo.  3. After shampooing, rinse your hair and body thoroughly to remove the shampoo.  4. Use CHG as you would any other liquid soap. You can apply CHG directly to the skin and wash gently with a clean washcloth.  5. Apply the CHG soap to your body only from the neck down. Do not use on open wounds or open sores. Avoid contact with your eyes, ears, mouth, and genitals (private parts). Wash face and genitals  (private parts) with your normal soap.  6. Wash thoroughly, paying special attention to the area where your surgery will be performed.  7. Thoroughly rinse your body with warm water.  8. Do not shower/wash with your normal soap after using and rinsing off the CHG soap.  9. Do not use lotions, oils, etc., after showering with CHG.  10. Pat yourself dry with a clean towel.  11. Wear clean pajamas to bed the night before surgery.  12. Place clean sheets on your bed the night of your shower  and do not sleep with pets.  13. Do not apply any deodorants/lotions/powders.  14. Please wear clean clothes to the hospital.  15. Remember to brush your teeth with your regular toothpaste.   Merchandiser, Retail to address health-related social needs:  [email or web address]

## 2024-01-04 LAB — CBC WITH DIFFERENTIAL/PLATELET
Basophils Absolute: 0.1 x10E3/uL (ref 0.0–0.2)
Basos: 1 %
EOS (ABSOLUTE): 0.3 x10E3/uL (ref 0.0–0.4)
Eos: 5 %
Hematocrit: 41.5 % (ref 34.0–46.6)
Hemoglobin: 13.3 g/dL (ref 11.1–15.9)
Immature Grans (Abs): 0 x10E3/uL (ref 0.0–0.1)
Immature Granulocytes: 0 %
Lymphocytes Absolute: 1.6 x10E3/uL (ref 0.7–3.1)
Lymphs: 27 %
MCH: 28.4 pg (ref 26.6–33.0)
MCHC: 32 g/dL (ref 31.5–35.7)
MCV: 89 fL (ref 79–97)
Monocytes Absolute: 0.5 x10E3/uL (ref 0.1–0.9)
Monocytes: 8 %
Neutrophils Absolute: 3.5 x10E3/uL (ref 1.4–7.0)
Neutrophils: 59 %
Platelets: 293 x10E3/uL (ref 150–450)
RBC: 4.69 x10E6/uL (ref 3.77–5.28)
RDW: 14.1 % (ref 11.7–15.4)
WBC: 6 x10E3/uL (ref 3.4–10.8)

## 2024-01-04 LAB — BASIC METABOLIC PANEL WITH GFR
BUN/Creatinine Ratio: 11 (ref 9–23)
BUN: 8 mg/dL (ref 6–20)
CO2: 21 mmol/L (ref 20–29)
Calcium: 9.8 mg/dL (ref 8.7–10.2)
Chloride: 105 mmol/L (ref 96–106)
Creatinine, Ser: 0.75 mg/dL (ref 0.57–1.00)
Glucose: 88 mg/dL (ref 70–99)
Potassium: 4.4 mmol/L (ref 3.5–5.2)
Sodium: 138 mmol/L (ref 134–144)
eGFR: 115 mL/min/1.73 (ref >59)

## 2024-01-04 LAB — LIPID PANEL
Chol/HDL Ratio: 3.4 ratio (ref 0.0–4.4)
Cholesterol, Total: 174 mg/dL (ref 100–199)
HDL: 51 mg/dL (ref >39)
LDL Chol Calc (NIH): 107 mg/dL — ABNORMAL HIGH (ref 0–99)
Triglycerides: 85 mg/dL (ref 0–149)
VLDL Cholesterol Cal: 16 mg/dL (ref 5–40)

## 2024-01-04 LAB — TSH+FREE T4
Free T4: 1.32 ng/dL (ref 0.82–1.77)
TSH: 1.68 u[IU]/mL (ref 0.450–4.500)

## 2024-01-06 ENCOUNTER — Encounter
Admission: RE | Admit: 2024-01-06 | Discharge: 2024-01-06 | Disposition: A | Discharge status: Home / Self Care | Source: Ambulatory Visit | Attending: Surgery | Admitting: Surgery

## 2024-01-06 ENCOUNTER — Other Ambulatory Visit: Payer: Self-pay

## 2024-01-06 DIAGNOSIS — Z01818 Encounter for other preprocedural examination: Secondary | ICD-10-CM

## 2024-01-06 HISTORY — DX: Personal history of diseases of the blood and blood-forming organs and certain disorders involving the immune mechanism: Z86.2

## 2024-01-06 HISTORY — DX: Cannabis use, unspecified, uncomplicated: F12.90

## 2024-01-06 HISTORY — DX: Gastro-esophageal reflux disease without esophagitis: K21.9

## 2024-01-06 HISTORY — DX: Tobacco use: Z72.0

## 2024-01-06 NOTE — Patient Instructions (Signed)
 Your procedure is scheduled on:01-07-24 Friday Report to the Registration Desk on the 1st floor of the Medical [facility].Then proceed to the 2nd floor Surgery Desk To find out your arrival time, please call [phone] between 1PM - 3PM on:01-06-24 Thursday If your arrival time is 6:00 am, do not arrive before that time as the Medical Mall entrance doors do not open until 6:00 am.  REMEMBER: Instructions that are not followed completely may result in serious medical risk, up to and including death; or upon the discretion of your surgeon and anesthesiologist your surgery may need to be rescheduled.  Do not eat food after midnight the night before surgery.  No gum chewing or hard candies.  You may however, drink CLEAR liquids up to 2 hours before you are scheduled to arrive for your surgery. Do not drink anything within 2 hours of your scheduled arrival time.  Clear liquids include: - water  - apple juice without pulp - gatorade (not RED colors) - black coffee or tea (Do NOT add milk or creamers to the coffee or tea) Do NOT drink anything that is not on this list.  One week prior to surgery:Stop NOW (01-06-24) Stop Anti-inflammatories (NSAIDS) such as Advil, Aleve, Ibuprofen, Motrin, Naproxen, Naprosyn and Aspirin based products such as Excedrin, Goody's Powder, BC Powder. Stop ANY OVER THE COUNTER supplements until after surgery.  You may however, continue to take Tylenol if needed for pain up until the day of surgery.  Continue taking all of your other prescription medications up until the day of surgery.  ON THE DAY OF SURGERY ONLY TAKE THESE MEDICATIONS WITH SIPS OF WATER: -FLUoxetine  (PROZAC )   Bring your Albuterol  Inhaler to the hospital  No Alcohol for 24 hours before or after surgery.  No Smoking including e-cigarettes for 24 hours before surgery.  No chewable tobacco products for at least 6 hours before surgery.  No nicotine patches on the day of surgery.  Do not use any  recreational drugs for at least a week (preferably 2 weeks) before your surgery.  Please be advised that the combination of cocaine and anesthesia may have negative outcomes, up to and including death. If you test positive for cocaine, your surgery will be cancelled.  On the morning of surgery brush your teeth with toothpaste and water, you may rinse your mouth with mouthwash if you wish. Do not swallow any toothpaste or mouthwash.  Do not wear jewelry, make-up, hairpins, clips or nail polish.  For welded (permanent) jewelry: bracelets, anklets, waist bands, etc.  Please have this removed prior to surgery.  If it is not removed, there is a chance that hospital personnel will need to cut it off on the day of surgery.  Do not wear lotions, powders, or perfumes.   Do not shave body hair from the neck down 48 hours before surgery.  Contact lenses, hearing aids and dentures may not be worn into surgery.  Do not bring valuables to the hospital. [facility] is not responsible for any missing/lost belongings or valuables.   Notify your doctor if there is any change in your medical condition (cold, fever, infection).  Wear comfortable clothing (specific to your surgery type) to the hospital.  After surgery, you can help prevent lung complications by doing breathing exercises.  Take deep breaths and cough every 1-2 hours. Your doctor may order a device called an Incentive Spirometer to help you take deep breaths. When coughing or sneezing, hold a pillow firmly against your incision with  both hands. This is called "splinting." Doing this helps protect your incision. It also decreases belly discomfort.  If you are being admitted to the hospital overnight, leave your suitcase in the car. After surgery it may be brought to your room.  In case of increased patient census, it may be necessary for you, the patient, to continue your postoperative care in the Same Day Surgery department.  If you are  being discharged the day of surgery, you will not be allowed to drive home. You will need a responsible individual to drive you home and stay with you for 24 hours after surgery.   If you are taking public transportation, you will need to have a responsible individual with you.  Please call the Pre-admissions Testing Dept. at [phone] if you have any questions about these instructions.  Surgery Visitation Policy:  Patients having surgery or a procedure may have two visitors.  Children under the age of 83 must have an adult with them who is not the patient.   Merchandiser, Retail to address health-related social needs:  [email or web address]

## 2024-01-07 ENCOUNTER — Encounter
Admission: RE | Disposition: A | Discharge status: Home / Self Care | Payer: Self-pay | Source: Home / Self Care | Attending: Surgery

## 2024-01-07 ENCOUNTER — Ambulatory Visit: Admitting: General Practice

## 2024-01-07 ENCOUNTER — Ambulatory Visit
Admission: RE | Admit: 2024-01-07 | Discharge: 2024-01-07 | Disposition: A | Discharge status: Home / Self Care | Attending: Surgery | Admitting: Surgery

## 2024-01-07 ENCOUNTER — Other Ambulatory Visit: Payer: Self-pay

## 2024-01-07 ENCOUNTER — Encounter: Payer: Self-pay | Admitting: Surgery

## 2024-01-07 DIAGNOSIS — F339 Major depressive disorder, recurrent, unspecified: Secondary | ICD-10-CM

## 2024-01-07 DIAGNOSIS — L0501 Pilonidal cyst with abscess: Secondary | ICD-10-CM | POA: Diagnosis present

## 2024-01-07 DIAGNOSIS — F32A Depression, unspecified: Secondary | ICD-10-CM | POA: Diagnosis not present

## 2024-01-07 DIAGNOSIS — Z01818 Encounter for other preprocedural examination: Secondary | ICD-10-CM

## 2024-01-07 DIAGNOSIS — F419 Anxiety disorder, unspecified: Secondary | ICD-10-CM | POA: Insufficient documentation

## 2024-01-07 HISTORY — PX: PROCEDURE, ADVANCEMENT FLAP, BACK: SHX7623

## 2024-01-07 HISTORY — PX: PILONIDAL CYST EXCISION: SHX744

## 2024-01-07 LAB — POCT PREGNANCY, URINE: Preg Test, Ur: NEGATIVE

## 2024-01-07 SURGERY — EXCISION, SIMPLE PILONIDAL CYST
Anesthesia: General | Site: Buttocks

## 2024-01-07 MED ORDER — KETOROLAC TROMETHAMINE 30 MG/ML IJ SOLN: INTRAMUSCULAR | Status: DC | PRN

## 2024-01-07 MED ORDER — DEXAMETHASONE SOD PHOSPHATE PF 10 MG/ML IJ SOLN
INTRAMUSCULAR | Status: DC | PRN
  Administered 2024-01-07: 10 mg via INTRAVENOUS

## 2024-01-07 MED ORDER — DEXMEDETOMIDINE HCL IN NACL 80 MCG/20ML IV SOLN
INTRAVENOUS | Status: DC | PRN
  Administered 2024-01-07: 8 ug via INTRAVENOUS

## 2024-01-07 MED ORDER — ACETAMINOPHEN 500 MG PO TABS
ORAL_TABLET | ORAL | Status: AC
  Filled 2024-01-07: qty 2

## 2024-01-07 MED ORDER — MEPERIDINE HCL 25 MG/ML IJ SOLN
INTRAMUSCULAR | Status: AC
  Filled 2024-01-07: qty 1

## 2024-01-07 MED ORDER — SUGAMMADEX SODIUM 200 MG/2ML IV SOLN
INTRAVENOUS | Status: DC | PRN
  Administered 2024-01-07: 400 mg via INTRAVENOUS

## 2024-01-07 MED ORDER — ONDANSETRON HCL 4 MG/2ML IJ SOLN
INTRAMUSCULAR | Status: AC
  Filled 2024-01-07: qty 2

## 2024-01-07 MED ORDER — GLYCOPYRROLATE 0.2 MG/ML IJ SOLN
INTRAMUSCULAR | Status: AC
  Filled 2024-01-07: qty 1

## 2024-01-07 MED ORDER — EPHEDRINE 5 MG/ML INJ
INTRAVENOUS | Status: AC
Start: 2024-01-07 — End: 2024-01-07
  Filled 2024-01-07: qty 5

## 2024-01-07 MED ORDER — OXYCODONE-ACETAMINOPHEN 5-325 MG PO TABS
1.0000 | ORAL_TABLET | Freq: Three times a day (TID) | ORAL | 0 refills | Status: AC | PRN
Start: 2024-01-07 — End: 2025-01-06
  Filled 2024-01-07: qty 6, 2d supply, fill #0

## 2024-01-07 MED ORDER — PROPOFOL 10 MG/ML IV BOLUS
INTRAVENOUS | Status: AC
  Filled 2024-01-07: qty 20

## 2024-01-07 MED ORDER — KETOROLAC TROMETHAMINE 30 MG/ML IJ SOLN
INTRAMUSCULAR | Status: AC
  Filled 2024-01-07: qty 1

## 2024-01-07 MED ORDER — OXYCODONE HCL 5 MG PO TABS
ORAL_TABLET | ORAL | Status: AC
  Filled 2024-01-07: qty 1

## 2024-01-07 MED ORDER — MIDAZOLAM HCL 2 MG/2ML IJ SOLN
INTRAMUSCULAR | Status: AC
  Filled 2024-01-07: qty 2

## 2024-01-07 MED ORDER — PROPOFOL 10 MG/ML IV BOLUS
INTRAVENOUS | Status: DC | PRN
  Administered 2024-01-07: 200 mg via INTRAVENOUS

## 2024-01-07 MED ORDER — KETOROLAC TROMETHAMINE 30 MG/ML IJ SOLN
INTRAMUSCULAR | Status: DC | PRN
  Administered 2024-01-07: 15 mg via INTRAVENOUS

## 2024-01-07 MED ORDER — ORAL CARE MOUTH RINSE: 15.0000 mL | Freq: Once | OROMUCOSAL | Status: AC

## 2024-01-07 MED ORDER — GLYCOPYRROLATE 0.2 MG/ML IJ SOLN
INTRAMUSCULAR | Status: DC | PRN
  Administered 2024-01-07: .1 mg via INTRAVENOUS

## 2024-01-07 MED ORDER — ACETAMINOPHEN 325 MG PO TABS
650.0000 mg | ORAL_TABLET | Freq: Three times a day (TID) | ORAL | 0 refills | Status: AC | PRN
  Filled 2024-01-07: qty 40, 7d supply, fill #0

## 2024-01-07 MED ORDER — CHLORHEXIDINE GLUCONATE CLOTH 2 % EX PADS
6.0000 | MEDICATED_PAD | Freq: Once | CUTANEOUS | Status: AC
  Administered 2024-01-07: 6 via TOPICAL

## 2024-01-07 MED ORDER — OXYCODONE HCL 5 MG/5ML PO SOLN: 5.0000 mg | Freq: Once | ORAL | Status: AC | PRN

## 2024-01-07 MED ORDER — OXYCODONE HCL 5 MG PO TABS
5.0000 mg | ORAL_TABLET | Freq: Once | ORAL | Status: AC | PRN
  Administered 2024-01-07: 5 mg via ORAL

## 2024-01-07 MED ORDER — CELECOXIB 200 MG PO CAPS
200.0000 mg | ORAL_CAPSULE | ORAL | Status: AC
  Administered 2024-01-07: 200 mg via ORAL

## 2024-01-07 MED ORDER — FENTANYL CITRATE (PF) 100 MCG/2ML IJ SOLN
INTRAMUSCULAR | Status: DC | PRN
  Administered 2024-01-07 (×2): 50 ug via INTRAVENOUS

## 2024-01-07 MED ORDER — CELECOXIB 200 MG PO CAPS
ORAL_CAPSULE | ORAL | Status: AC
  Filled 2024-01-07: qty 1

## 2024-01-07 MED ORDER — SEVOFLURANE IN SOLN
RESPIRATORY_TRACT | Status: AC
  Filled 2024-01-07: qty 250

## 2024-01-07 MED ORDER — CEFAZOLIN SODIUM-DEXTROSE 2-4 GM/100ML-% IV SOLN
INTRAVENOUS | Status: AC
Start: 2024-01-07 — End: 2024-01-07
  Filled 2024-01-07: qty 100

## 2024-01-07 MED ORDER — DIPHENHYDRAMINE HCL 50 MG/ML IJ SOLN
INTRAMUSCULAR | Status: AC
  Filled 2024-01-07: qty 1

## 2024-01-07 MED ORDER — FENTANYL CITRATE (PF) 100 MCG/2ML IJ SOLN
INTRAMUSCULAR | Status: AC
  Filled 2024-01-07: qty 2

## 2024-01-07 MED ORDER — ARTIFICIAL TEARS OPHTHALMIC OINT
TOPICAL_OINTMENT | OPHTHALMIC | Status: AC
  Filled 2024-01-07: qty 3.5

## 2024-01-07 MED ORDER — GABAPENTIN 300 MG PO CAPS
300.0000 mg | ORAL_CAPSULE | ORAL | Status: AC
  Administered 2024-01-07: 300 mg via ORAL

## 2024-01-07 MED ORDER — CHLORHEXIDINE GLUCONATE 0.12 % MT SOLN
15.0000 mL | Freq: Once | OROMUCOSAL | Status: AC
  Administered 2024-01-07: 15 mL via OROMUCOSAL

## 2024-01-07 MED ORDER — EPHEDRINE SULFATE-NACL 50-0.9 MG/10ML-% IV SOSY
PREFILLED_SYRINGE | INTRAVENOUS | Status: DC | PRN
  Administered 2024-01-07: 10 mg via INTRAVENOUS
  Administered 2024-01-07: 5 mg via INTRAVENOUS

## 2024-01-07 MED ORDER — MIDAZOLAM HCL (PF) 2 MG/2ML IJ SOLN
INTRAMUSCULAR | Status: DC | PRN
  Administered 2024-01-07: 2 mg via INTRAVENOUS

## 2024-01-07 MED ORDER — ROCURONIUM BROMIDE 10 MG/ML (PF) SYRINGE
PREFILLED_SYRINGE | INTRAVENOUS | Status: AC
Start: 2024-01-07 — End: 2024-01-07
  Filled 2024-01-07: qty 10

## 2024-01-07 MED ORDER — DIPHENHYDRAMINE HCL 50 MG/ML IJ SOLN
INTRAMUSCULAR | Status: DC | PRN
  Administered 2024-01-07: 25 mg via INTRAVENOUS

## 2024-01-07 MED ORDER — 0.9 % SODIUM CHLORIDE (POUR BTL) OPTIME
TOPICAL | Status: DC | PRN
  Administered 2024-01-07: 500 mL

## 2024-01-07 MED ORDER — LIDOCAINE HCL (CARDIAC) PF 100 MG/5ML IV SOSY
PREFILLED_SYRINGE | INTRAVENOUS | Status: DC | PRN
  Administered 2024-01-07 (×2): 50 mg via INTRAVENOUS

## 2024-01-07 MED ORDER — LIDOCAINE HCL (PF) 2 % IJ SOLN
INTRAMUSCULAR | Status: AC
  Filled 2024-01-07: qty 5

## 2024-01-07 MED ORDER — ACETAMINOPHEN 500 MG PO TABS
1000.0000 mg | ORAL_TABLET | ORAL | Status: AC
  Administered 2024-01-07: 1000 mg via ORAL

## 2024-01-07 MED ORDER — CHLORHEXIDINE GLUCONATE 0.12 % MT SOLN
OROMUCOSAL | Status: AC
  Filled 2024-01-07: qty 15

## 2024-01-07 MED ORDER — BUPIVACAINE-EPINEPHRINE 0.5% -1:200000 IJ SOLN
INTRAMUSCULAR | Status: DC | PRN
  Administered 2024-01-07: 30 mL

## 2024-01-07 MED ORDER — MEPERIDINE HCL 25 MG/ML IJ SOLN
INTRAMUSCULAR | Status: DC | PRN
  Administered 2024-01-07: 25 mg via INTRAVENOUS

## 2024-01-07 MED ORDER — DOCUSATE SODIUM 100 MG PO CAPS
100.0000 mg | ORAL_CAPSULE | Freq: Two times a day (BID) | ORAL | 0 refills | Status: AC | PRN
  Filled 2024-01-07: qty 20, 10d supply, fill #0

## 2024-01-07 MED ORDER — IBUPROFEN 800 MG PO TABS
800.0000 mg | ORAL_TABLET | Freq: Three times a day (TID) | ORAL | 0 refills | Status: AC | PRN
  Filled 2024-01-07: qty 30, 10d supply, fill #0

## 2024-01-07 MED ORDER — FENTANYL CITRATE (PF) 100 MCG/2ML IJ SOLN: 25.0000 ug | INTRAMUSCULAR | Status: DC | PRN

## 2024-01-07 MED ORDER — GABAPENTIN 300 MG PO CAPS
ORAL_CAPSULE | ORAL | Status: AC
Start: 2024-01-07 — End: 2024-01-07
  Filled 2024-01-07: qty 1

## 2024-01-07 MED ORDER — LACTATED RINGERS IV SOLN: INTRAVENOUS | Status: DC

## 2024-01-07 MED ORDER — BUPIVACAINE-EPINEPHRINE (PF) 0.5% -1:200000 IJ SOLN
INTRAMUSCULAR | Status: AC
  Filled 2024-01-07: qty 30

## 2024-01-07 MED ORDER — CEFAZOLIN SODIUM-DEXTROSE 2-4 GM/100ML-% IV SOLN
2.0000 g | INTRAVENOUS | Status: AC
  Administered 2024-01-07: 2 g via INTRAVENOUS

## 2024-01-07 MED ORDER — ROCURONIUM BROMIDE 100 MG/10ML IV SOLN
INTRAVENOUS | Status: DC | PRN
  Administered 2024-01-07: 50 mg via INTRAVENOUS

## 2024-01-07 SURGICAL SUPPLY — 31 items
ADHESIVE MASTISOL STRL (MISCELLANEOUS) ×1 IMPLANT
BLADE CLIPPER SURG (BLADE) ×1 IMPLANT
BLADE SURG SZ10 CARB STEEL (BLADE) ×1 IMPLANT
BRIEF MESH DISP 2XL (UNDERPADS AND DIAPERS) ×1 IMPLANT
BRUSH SCRUB EZ 4% CHG (MISCELLANEOUS) ×1 IMPLANT
DRAIN CHANNEL JP 15F RND 3/16 (MISCELLANEOUS) IMPLANT
DRAPE LAPAROTOMY 100X77 ABD (DRAPES) ×1 IMPLANT
ELECTRODE REM PT RTRN 9FT ADLT (ELECTROSURGICAL) ×1 IMPLANT
EVACUATOR SILICONE 100CC (DRAIN) IMPLANT
GAUZE 4X4 16PLY ~~LOC~~+RFID DBL (SPONGE) IMPLANT
GAUZE SPONGE 4X4 12PLY STRL (GAUZE/BANDAGES/DRESSINGS) IMPLANT
GLOVE BIOGEL PI IND STRL 7.0 (GLOVE) ×1 IMPLANT
GLOVE SURG SYN 6.5 PF PI (GLOVE) ×3 IMPLANT
GOWN STRL REUS W/ TWL LRG LVL3 (GOWN DISPOSABLE) ×3 IMPLANT
KIT TURNOVER KIT A (KITS) ×1 IMPLANT
MANIFOLD NEPTUNE II (INSTRUMENTS) ×1 IMPLANT
NDL HYPO 22X1.5 SAFETY MO (MISCELLANEOUS) ×1 IMPLANT
NEEDLE HYPO 22X1.5 SAFETY MO (MISCELLANEOUS) ×1 IMPLANT
NS IRRIG 500ML POUR BTL (IV SOLUTION) ×1 IMPLANT
PACK BASIN MINOR ARMC (MISCELLANEOUS) ×1 IMPLANT
PAD ABD DERMACEA PRESS 5X9 (GAUZE/BANDAGES/DRESSINGS) ×1 IMPLANT
SOLUTION PREP PVP 2OZ (MISCELLANEOUS) ×1 IMPLANT
SUT ETHILON 3-0 (SUTURE) IMPLANT
SUT VIC AB 3-0 SH 27X BRD (SUTURE) IMPLANT
SUTURE MNCRL 4-0 27XMF (SUTURE) IMPLANT
SYR 10ML LL (SYRINGE) ×1 IMPLANT
SYR 20ML LL LF (SYRINGE) ×1 IMPLANT
SYR BULB IRRIG 60ML STRL (SYRINGE) ×1 IMPLANT
TAPE CLOTH 3X10 WHT NS LF (GAUZE/BANDAGES/DRESSINGS) ×1 IMPLANT
TRAP FLUID SMOKE EVACUATOR (MISCELLANEOUS) ×1 IMPLANT
WATER STERILE IRR 500ML POUR (IV SOLUTION) ×1 IMPLANT

## 2024-01-07 NOTE — Anesthesia Postprocedure Evaluation (Signed)
 Anesthesia Post Note  Patient: Hildreth MALVA Grumbling  Procedure(s) Performed: EXCISION, SIMPLE PILONIDAL CYST (Buttocks) PROCEDURE, ADVANCEMENT FLAP, BACK (Buttocks)  Patient location during evaluation: PACU Anesthesia Type: General Level of consciousness: awake and alert Pain management: pain level controlled Vital Signs Assessment: post-procedure vital signs reviewed and stable Respiratory status: spontaneous breathing, nonlabored ventilation, respiratory function stable and patient connected to nasal cannula oxygen Cardiovascular status: blood pressure returned to baseline and stable Postop Assessment: no apparent nausea or vomiting Anesthetic complications: no   No notable events documented.   Last Vitals:  Vitals:   01/07/24 1230 01/07/24 1245  BP: 128/80 137/88  Pulse: (!) 117 (!) 110  Resp: 18 14  Temp:  (!) 36.2 C  SpO2: 99% 100%    Last Pain:  Vitals:   01/07/24 1245  TempSrc:   PainSc: 2                  Debby Mines

## 2024-01-07 NOTE — Discharge Instructions (Signed)
 Removal, Care After This sheet gives you information about how to care for yourself after your procedure. Your health care provider may also give you more specific instructions. If you have problems or questions, contact your health care provider. What can I expect after the procedure? After the procedure, it is common to have: Soreness. Bruising. Itching. Follow these instructions at home: site care Follow instructions from your health care provider about how to take care of your site. Make sure you: Wash your hands with soap and water before and after you change your bandage (dressing). If soap and water are not available, use hand sanitizer. Leave stitches (sutures), skin glue, or adhesive strips in place. These skin closures may need to stay in place for 2 weeks or longer. If adhesive strip edges start to loosen and curl up, you may trim the loose edges. Do not remove adhesive strips completely unless your health care provider tells you to do that. If the area bleeds or bruises, apply gentle pressure for 10 minutes. OK TO SHOWER IN 24HRS  Check your site every day for signs of infection. Check for: Redness, swelling, or pain. Fluid or blood. Warmth. Pus or a bad smell.  General instructions Rest and then return to your normal activities as told by your health care provider.  tylenol and advil as needed for discomfort.  Please alternate between the two every four hours as needed for pain.    Use narcotics, if prescribed, only when tylenol and motrin is not enough to control pain.  325-650mg  every 8hrs to max of 3000mg /24hrs (including the 325mg  in every norco dose) for the tylenol.    Advil up to 800mg  per dose every 8hrs as needed for pain.   Keep all follow-up visits as told by your health care provider. This is important. Contact a health care provider if: You have redness, swelling, or pain around your site. You have fluid or blood coming from your site. Your site feels warm to  the touch. You have pus or a bad smell coming from your site. You have a fever. Your sutures, skin glue, or adhesive strips loosen or come off sooner than expected. Get help right away if: You have bleeding that does not stop with pressure or a dressing. Summary After the procedure, it is common to have some soreness, bruising, and itching at the site. Follow instructions from your health care provider about how to take care of your site. Check your site every day for signs of infection. Contact a health care provider if you have redness, swelling, or pain around your site, or your site feels warm to the touch. Keep all follow-up visits as told by your health care provider. This is important. This information is not intended to replace advice given to you by your health care provider. Make sure you discuss any questions you have with your health care provider. Document Released: 03/08/2015 Document Revised: 08/09/2017 Document Reviewed: 08/09/2017 Elsevier Interactive Patient Education  [employer].

## 2024-01-07 NOTE — Interval H&P Note (Signed)
 No change. OK to proceed.

## 2024-01-07 NOTE — Op Note (Signed)
Pre-Op Dx: pilonidal disease Post-Op Dx: same Anesthesia: GETA EBL: 4mL Complications:  none apparent Specimen: pilonidal disease Procedure: pilonidal cystectomy and bascom flap closure  Surgeon: Tonna Boehringer  Indications for procedure: See H&P  Description of Procedure:  Consent obtained, time out performed.  Patient placed in prone position.  Area sterilized and draped in usual position.  Gluteal fold approximation marked and then separted with tape.  Time out performed. Local infused to area previously marked.  18cm elliptical incision made through dermis with 15blade, incorporating the diseased tissue adjacent to the medial edge of ellipse and pilonidal disease noted in subcutaneous layer.  Skin flap was then raised on the contralateral aspect of the more diseased portion (left side in this patient) to the extent of previously marked fold line.  Tape released and flap noted to extend over midline to contralateral fold mark under minimal tension. The diseased portion was then excised completely down to healthy sacral fascia and surrounding fat, passed off field pending pathology.    Wound irrigated and hemostasis noted, then flap advanced over open wound and closed in multi  layer fashion with 3-0 vicryl in interrupted fashion for deep subq layers.  15Fr drain placed right under dermal layer and flap and secured to skin using 3-0 nylon. Additional 3-0 vicryl used to approximate the dermal layer, then running 4-0 monocryl in subcuticular fashion for epidermal layer.  Wound then dressed with dermabond, drain sponge around drain site.  Pt tolerated procedure well, and transferred to PACU in stable condition. Sponge and instrument count correct at end of procedure.

## 2024-01-07 NOTE — Transfer of Care (Signed)
 Immediate Anesthesia Transfer of Care Note  Patient: Kerry Mcgee  Procedure(s) Performed: EXCISION, SIMPLE PILONIDAL CYST (Buttocks) PROCEDURE, ADVANCEMENT FLAP, BACK (Buttocks)  Patient Location: PACU  Anesthesia Type:General  Level of Consciousness: awake and alert   Airway & Oxygen Therapy: Patient Spontanous Breathing and Patient connected to face mask oxygen  Post-op Assessment: Report given to RN and Post -op Vital signs reviewed and stable  Post vital signs: Reviewed and stable  Last Vitals:  Vitals Value Taken Time  BP 130/74 01/07/24 12:00  Temp 36.1 C 01/07/24 12:00  Pulse 132 01/07/24 12:03  Resp 7 01/07/24 12:03  SpO2 100 % 01/07/24 12:03  Vitals shown include unfiled device data.  Last Pain:  Vitals:   01/07/24 1018  TempSrc: Temporal  PainSc: 7          Complications: No notable events documented.

## 2024-01-07 NOTE — Anesthesia Procedure Notes (Signed)
 Procedure Name: Intubation Date/Time: 01/07/2024 10:51 AM  Performed by: Trudy Rankin LABOR, CRNAPre-anesthesia Checklist: Patient identified, Patient being monitored, Timeout performed, Emergency Drugs available and Suction available Patient Re-evaluated:Patient Re-evaluated prior to induction Oxygen Delivery Method: Circle System Utilized Preoxygenation: Pre-oxygenation with 100% oxygen Induction Type: IV induction and Rapid sequence Laryngoscope Size: Mac, 3 and McGrath Grade View: Grade I Tube type: Oral Tube size: 7.0 mm Number of attempts: 1 Airway Equipment and Method: Stylet Placement Confirmation: ETT inserted through vocal cords under direct vision, positive ETCO2 and breath sounds checked- equal and bilateral Secured at: 20 cm Tube secured with: Tape Dental Injury: Teeth and Oropharynx as per pre-operative assessment

## 2024-01-07 NOTE — Anesthesia Preprocedure Evaluation (Signed)
 Anesthesia Evaluation  Patient identified by MRN, date of birth, ID band Patient awake    Reviewed: Allergy & Precautions, NPO status , Patient's Chart, lab work & pertinent test results  Airway Mallampati: II  TM Distance: >3 FB Neck ROM: full    Dental  (+) Chipped   Pulmonary asthma , Patient abstained from smoking.   Pulmonary exam normal        Cardiovascular negative cardio ROS Normal cardiovascular exam     Neuro/Psych  PSYCHIATRIC DISORDERS Anxiety Depression    negative neurological ROS     GI/Hepatic negative GI ROS, Neg liver ROS,,,  Endo/Other  negative endocrine ROS    Renal/GU      Musculoskeletal   Abdominal   Peds  Hematology negative hematology ROS (+)   Anesthesia Other Findings Per patient request, will not give zofran.   Past Medical History: No date: Allergy No date: Anxiety No date: Asthma No date: Depression No date: GERD (gastroesophageal reflux disease) No date: Hidradenitis suppurativa No date: History of anemia No date: Marijuana use No date: Pilonidal cyst No date: Vapes nicotine containing substance  Past Surgical History: No date: NO PAST SURGERIES  BMI    Body Mass Index: 26.57 kg/m      Reproductive/Obstetrics negative OB ROS                              Anesthesia Physical Anesthesia Plan  ASA: 2  Anesthesia Plan: General ETT   Post-op Pain Management:    Induction: Intravenous  PONV Risk Score and Plan: 3 and Ondansetron, Dexamethasone and Midazolam  Airway Management Planned: Oral ETT  Additional Equipment:   Intra-op Plan:   Post-operative Plan: Extubation in OR  Informed Consent: I have reviewed the patients History and Physical, chart, labs and discussed the procedure including the risks, benefits and alternatives for the proposed anesthesia with the patient or authorized representative who has indicated his/her  understanding and acceptance.     Dental Advisory Given  Plan Discussed with: Anesthesiologist, CRNA and Surgeon  Anesthesia Plan Comments: (Patient consented for risks of anesthesia including but not limited to:  - adverse reactions to medications - damage to eyes, teeth, lips or other oral mucosa - nerve damage due to positioning  - sore throat or hoarseness - Damage to heart, brain, nerves, lungs, other parts of body or loss of life  Patient voiced understanding and assent.)        Anesthesia Quick Evaluation

## 2024-01-08 ENCOUNTER — Encounter: Payer: Self-pay | Admitting: Surgery

## 2024-01-10 LAB — SURGICAL PATHOLOGY

## 2024-01-17 ENCOUNTER — Ambulatory Visit: Payer: Self-pay | Admitting: Physician Assistant

## 2024-03-19 ENCOUNTER — Other Ambulatory Visit: Payer: Self-pay | Admitting: Physician Assistant

## 2024-03-19 DIAGNOSIS — J302 Other seasonal allergic rhinitis: Secondary | ICD-10-CM

## 2024-03-19 DIAGNOSIS — J4540 Moderate persistent asthma, uncomplicated: Secondary | ICD-10-CM

## 2024-03-20 NOTE — Telephone Encounter (Signed)
 Requested Prescriptions  Pending Prescriptions Disp Refills   montelukast  (SINGULAIR ) 10 MG tablet [Pharmacy Med Name: MONTELUKAST  SOD 10 MG TABLET] 90 tablet 0    Sig: TAKE 1 TABLET BY MOUTH EVERYDAY AT BEDTIME     Pulmonology:  Leukotriene Inhibitors Passed - 03/20/2024  2:48 PM      Passed - Valid encounter within last 12 months    Recent Outpatient Visits           2 months ago Annual physical exam   [place] [facility] [place], [place], PA-C   5 months ago Hidradenitis suppurativa of multiple sites   [facility] [place], [place], PA-C   8 months ago Vaginal discharge   [place] [facility] [place], [place], PA-C   9 months ago Abscess of right axilla   [facility] [place], [place], PA-C

## 2024-03-30 ENCOUNTER — Other Ambulatory Visit: Payer: Self-pay | Admitting: Physician Assistant

## 2024-03-30 DIAGNOSIS — F419 Anxiety disorder, unspecified: Secondary | ICD-10-CM

## 2024-03-30 DIAGNOSIS — F339 Major depressive disorder, recurrent, unspecified: Secondary | ICD-10-CM
